# Patient Record
Sex: Female | Born: 1965 | Race: White | Hispanic: No | Marital: Married | Smoking: Never smoker
Health system: Southern US, Community
[De-identification: ages and names within clinical notes are randomized; demographics above are authoritative.]

## PROBLEM LIST (undated history)

## (undated) DIAGNOSIS — H81391 Other peripheral vertigo, right ear: Secondary | ICD-10-CM

## (undated) DIAGNOSIS — I10 Essential (primary) hypertension: Secondary | ICD-10-CM

## (undated) DIAGNOSIS — G43109 Migraine with aura, not intractable, without status migrainosus: Secondary | ICD-10-CM

## (undated) DIAGNOSIS — D329 Benign neoplasm of meninges, unspecified: Secondary | ICD-10-CM

## (undated) DIAGNOSIS — H8309 Labyrinthitis, unspecified ear: Secondary | ICD-10-CM

## (undated) DIAGNOSIS — E785 Hyperlipidemia, unspecified: Secondary | ICD-10-CM

## (undated) HISTORY — PX: OVARIAN CYST SURGERY: SHX726

## (undated) HISTORY — PX: ABDOMINAL SURGERY: SHX537

---

## 2015-04-07 ENCOUNTER — Encounter (HOSPITAL_COMMUNITY): Payer: Self-pay | Admitting: Emergency Medicine

## 2015-04-07 ENCOUNTER — Emergency Department (HOSPITAL_COMMUNITY)
Admission: EM | Admit: 2015-04-07 | Discharge: 2015-04-07 | Disposition: A | Discharge status: Home / Self Care | Payer: Medicaid Other | Source: Home / Self Care | Attending: Emergency Medicine | Admitting: Emergency Medicine

## 2015-04-07 DIAGNOSIS — E785 Hyperlipidemia, unspecified: Secondary | ICD-10-CM

## 2015-04-07 DIAGNOSIS — I10 Essential (primary) hypertension: Secondary | ICD-10-CM | POA: Diagnosis not present

## 2015-04-07 HISTORY — DX: Hyperlipidemia, unspecified: E78.5

## 2015-04-07 HISTORY — DX: Essential (primary) hypertension: I10

## 2015-04-07 MED ORDER — NEBIVOLOL HCL 10 MG PO TABS: 10.0000 mg | ORAL_TABLET | Freq: Two times a day (BID) | ORAL | Status: DC

## 2015-04-07 MED ORDER — NEBIVOLOL HCL 5 MG PO TABS: 5.0000 mg | ORAL_TABLET | Freq: Two times a day (BID) | ORAL | Status: DC

## 2015-04-07 MED ORDER — ATORVASTATIN CALCIUM 20 MG PO TABS: 20.0000 mg | ORAL_TABLET | Freq: Every day | ORAL | Status: DC

## 2015-04-07 NOTE — Discharge Instructions (Signed)
I have refilled your Nebivolol. Medicaid does not cover the cholesterol medication.  I have changed it to Lipitor 20mg  daily. Follow up with a family doctor as soon as possible.

## 2015-04-07 NOTE — ED Provider Notes (Signed)
CSN: [id number]     Arrival date & time 04/07/15  1610 History   First MD Initiated Contact with Patient 04/07/15 1723     Chief Complaint  Patient presents with  . Medication Refill   (Consider location/radiation/quality/duration/timing/severity/associated sxs/prior Treatment) HPI  She is a 49 year old woman here for medication refill. She just arrived to [place] 2 weeks ago from [place]. She is a [place] refugee. In [place] she was diagnosed with hypertension and hyperlipidemia. She was started on Bystolic and Crestor.  She had an echocardiogram done that showed normal ejection fracture with some diastolic dysfunction. She reports feeling well, that she only has a week of her medication left. She states her case manager is getting her an appointment with a primary care doctor.  Past Medical History  Diagnosis Date  . Hypertension   . HLD (hyperlipidemia)    Past Surgical History  Procedure Laterality Date  . Abdominal surgery     History reviewed. No pertinent family history. History  Substance Use Topics  . Smoking status: Never Smoker   . Smokeless tobacco: Never Used  . Alcohol Use: No   OB History    No data available     Review of Systems As in history of present illness Allergies  Review of patient's allergies indicates no known allergies.  Home Medications   Prior to Admission medications   Medication Sig Start Date End Date Taking? Authorizing Provider  UNABLE TO FIND Take 10 mg by mouth daily. Med Name: Myrtie Neither [facility])   Yes Historical Provider, MD  atorvastatin (LIPITOR) 20 MG tablet Take 1 tablet (20 mg total) by mouth daily. 04/07/15   Melony Overly, MD  nebivolol (BYSTOLIC) 10 MG tablet Take 1 tablet (10 mg total) by mouth 2 (two) times daily. 04/07/15   Melony Overly, MD   BP 146/68 mmHg  Pulse 65  Temp(Src) 98 F (36.7 C) (Oral)  Resp 16  SpO2 97%  LMP 03/24/2015 (Approximate) Physical Exam  Constitutional: She is oriented to person, place, and  time. She appears well-developed and well-nourished. No distress.  Neck: Neck supple.  Cardiovascular: Normal rate, regular rhythm and normal heart sounds.   No murmur heard. Pulmonary/Chest: Effort normal. No respiratory distress. She has no wheezes. She has no rales.  Musculoskeletal: She exhibits no edema.  Neurological: She is alert and oriented to person, place, and time.    ED Course  Procedures (including critical care time) Labs Review Labs Reviewed - No data to display  Imaging Review No results found.   MDM   1. Essential hypertension   2. Hyperlipidemia    I have refilled the Bystolic. I changed Crestor to Lipitor 20 mg daily due to cost. She will establish with a primary care physician as soon as possible.    Melony Overly, MD 04/07/15 [phone]

## 2015-04-07 NOTE — ED Notes (Signed)
Pt has newly arrived here from [place] two weeks ago.  Pt needs a refill on her HBP and HLD medications.  Pt denies any issues.

## 2015-04-20 ENCOUNTER — Encounter (HOSPITAL_COMMUNITY): Payer: Self-pay | Admitting: Emergency Medicine

## 2015-04-20 ENCOUNTER — Emergency Department (HOSPITAL_COMMUNITY)
Admission: EM | Admit: 2015-04-20 | Discharge: 2015-04-20 | Disposition: A | Discharge status: Home / Self Care | Payer: Medicaid Other | Attending: Emergency Medicine | Admitting: Emergency Medicine

## 2015-04-20 ENCOUNTER — Emergency Department (HOSPITAL_COMMUNITY): Payer: Medicaid Other

## 2015-04-20 DIAGNOSIS — Y9302 Activity, running: Secondary | ICD-10-CM | POA: Insufficient documentation

## 2015-04-20 DIAGNOSIS — E785 Hyperlipidemia, unspecified: Secondary | ICD-10-CM | POA: Insufficient documentation

## 2015-04-20 DIAGNOSIS — W1840XA Slipping, tripping and stumbling without falling, unspecified, initial encounter: Secondary | ICD-10-CM | POA: Diagnosis not present

## 2015-04-20 DIAGNOSIS — Y998 Other external cause status: Secondary | ICD-10-CM | POA: Diagnosis not present

## 2015-04-20 DIAGNOSIS — I10 Essential (primary) hypertension: Secondary | ICD-10-CM | POA: Insufficient documentation

## 2015-04-20 DIAGNOSIS — Y9289 Other specified places as the place of occurrence of the external cause: Secondary | ICD-10-CM | POA: Insufficient documentation

## 2015-04-20 DIAGNOSIS — Z79899 Other long term (current) drug therapy: Secondary | ICD-10-CM | POA: Insufficient documentation

## 2015-04-20 DIAGNOSIS — S8992XA Unspecified injury of left lower leg, initial encounter: Secondary | ICD-10-CM | POA: Diagnosis not present

## 2015-04-20 MED ORDER — CYCLOBENZAPRINE HCL 10 MG PO TABS
5.0000 mg | ORAL_TABLET | Freq: Once | ORAL | Status: AC
  Administered 2015-04-20: 5 mg via ORAL
  Filled 2015-04-20: qty 1

## 2015-04-20 MED ORDER — MELOXICAM 15 MG PO TABS: 15.0000 mg | ORAL_TABLET | Freq: Every day | ORAL | Status: DC

## 2015-04-20 MED ORDER — IBUPROFEN 400 MG PO TABS
800.0000 mg | ORAL_TABLET | Freq: Once | ORAL | Status: AC
Start: 2015-04-20 — End: 2015-04-20
  Administered 2015-04-20: 800 mg via ORAL
  Filled 2015-04-20: qty 2

## 2015-04-20 MED ORDER — CYCLOBENZAPRINE HCL 5 MG PO TABS: 10.0000 mg | ORAL_TABLET | Freq: Two times a day (BID) | ORAL | Status: DC | PRN

## 2015-04-20 NOTE — ED Notes (Signed)
Pt reports that she injured L knee 43mo ago and last week she slipped making her knee hurt again. Today pt sts she has been unable to get around the house easily.

## 2015-04-20 NOTE — ED Provider Notes (Signed)
CSN: [id number]     Arrival date & time 04/20/15  1956 History  This chart was scribed for non-physician practitioner, Randa Lynn. Carlota Raspberry, PA-C working with Daleen Bo, MD by Tula Nakayama, ED scribe. This patient was seen in room TR06C/TR06C and the patient's care was started at 10:51 PM   Chief Complaint  Patient presents with  . Knee Pain   HPI  HPI Comments: Katelyn Fields is a 49 y.o. female who presents to the Emergency Department complaining of constant, moderate left knee pain that intermittently radiates to her left shin, started 7 months ago and was exacerbated last week. She states her pain becomes worse with praying on her knees, sitting and walking long distances. Pt reports that she initially injured her anterior knee 7 months ago while she was getting off of a bus. Her pain became worse last week when she was running in the rain, slipped, and felt her knee turn inward while her foot went outward. Pt denies swelling, bruising, numbness and tingling as associated symptoms.  No loc, head or neck pain.   Past Medical History  Diagnosis Date  . Hypertension   . HLD (hyperlipidemia)    Past Surgical History  Procedure Laterality Date  . Abdominal surgery     No family history on file. History  Substance Use Topics  . Smoking status: Never Smoker   . Smokeless tobacco: Never Used  . Alcohol Use: No   OB History    No data available     Review of Systems  Musculoskeletal: Positive for arthralgias and gait problem. Negative for joint swelling.  Skin: Negative for color change and wound.  Neurological: Negative for numbness.  All other systems reviewed and are negative.  Allergies  Review of patient's allergies indicates no known allergies.  Home Medications   Prior to Admission medications   Medication Sig Start Date End Date Taking? Authorizing Provider  atorvastatin (LIPITOR) 20 MG tablet Take 1 tablet (20 mg total) by mouth daily. 04/07/15   Melony Overly, MD   cyclobenzaprine (FLEXERIL) 5 MG tablet Take 2 tablets (10 mg total) by mouth 2 (two) times daily as needed for muscle spasms. 04/20/15   Kimi Kroft Carlota Raspberry, PA-C  meloxicam (MOBIC) 15 MG tablet Take 1 tablet (15 mg total) by mouth daily. 04/20/15   Abilene Mcphee Carlota Raspberry, PA-C  nebivolol (BYSTOLIC) 5 MG tablet Take 1 tablet (5 mg total) by mouth 2 (two) times daily. 04/07/15   Melony Overly, MD  UNABLE TO FIND Take 10 mg by mouth daily. Med Name: Myrtie Neither [facility])    Historical Provider, MD   BP 130/56 mmHg  Pulse 65  Temp(Src) 97.9 F (36.6 C) (Oral)  Resp 18  Ht 5\' 3"  (1.6 m)  Wt 196 lb 3.3 oz (88.999 kg)  BMI 34.77 kg/m2  SpO2 97%  LMP 04/20/2015 Physical Exam  Constitutional: She appears well-developed and well-nourished. No distress.  HENT:  Head: Normocephalic and atraumatic.  Eyes: Conjunctivae and EOM are normal.  Neck: Neck supple. No tracheal deviation present.  Cardiovascular: Normal rate.   Pulmonary/Chest: Effort normal. No respiratory distress.  Musculoskeletal:       Left knee: She exhibits decreased range of motion (due to pain) and effusion. She exhibits no ecchymosis and no deformity. Tenderness found. Medial joint line and lateral joint line tenderness noted.  Exam limited due to body habitus.  Skin: Skin is warm and dry.  Psychiatric: She has a normal mood and affect. Her behavior is normal.  Nursing  note and vitals reviewed.   ED Course  Procedures   DIAGNOSTIC STUDIES: Oxygen Saturation is 97% on RA, normal by my interpretation.    COORDINATION OF CARE: 10:58 PM Discussed treatment plan with pt which includes a knee immobilizer, crutches, Mobic, muscle relaxer and an ortho follow-up. Advised pt to use RICE method. She agreed to plan.  Labs Review Labs Reviewed - No data to display  Imaging Review Dg Knee Complete 4 Views Left  04/20/2015   CLINICAL DATA:  Pain following fall  EXAM: LEFT KNEE - COMPLETE 4+ VIEW  COMPARISON:  None.  FINDINGS: Frontal, lateral, and  bilateral oblique views were obtained. There is no fracture or dislocation. No appreciable joint effusion. Joint spaces appear intact. No erosive change.  IMPRESSION: No fracture or dislocation. No appreciable arthropathy. No effusion.   Electronically Signed   By: Lowella Grip III M.D.   On: 04/20/2015 22:12     EKG Interpretation None      MDM   Final diagnoses:  Knee injury, left, initial encounter    Medications  ibuprofen (ADVIL,MOTRIN) tablet 800 mg (not administered)  cyclobenzaprine (FLEXERIL) tablet 5 mg (not administered)    48 y.o.Katelyn Fields's evaluation in the Emergency Department is complete. It has been determined that no acute conditions requiring further emergency intervention are present at this time. The patient/guardian have been advised of the diagnosis and plan. We have discussed signs and symptoms that warrant return to the ED, such as changes or worsening in symptoms.  Vital signs are stable at discharge. Filed Vitals:   04/20/15 2004  BP: 130/56  Pulse: 65  Temp: 97.9 F (36.6 C)  Resp: 18    Patient/guardian has voiced understanding and agreed to follow-up with the PCP or specialist.  I personally performed the services described in this documentation, which was scribed in my presence. The recorded information has been reviewed and is accurate.    Delos Haring, PA-C 04/20/15 [place], MD 04/21/15 [phone]

## 2015-04-20 NOTE — Discharge Instructions (Signed)

## 2015-04-20 NOTE — ED Notes (Signed)
Pt stable, ambulatory, states understanding of discharge instructions 

## 2015-05-12 ENCOUNTER — Telehealth: Payer: Self-pay | Admitting: *Deleted

## 2015-05-12 NOTE — Telephone Encounter (Signed)
Nelly from [facility] office called to request NPI number for patient.  Pt has appt for new patient (refugee).  NPI given x1, pt needs to contact medicaid office to change provider information on card. Derl Barrow, RN

## 2015-06-03 ENCOUNTER — Ambulatory Visit: Payer: Medicaid Other

## 2015-10-07 DIAGNOSIS — N912 Amenorrhea, unspecified: Secondary | ICD-10-CM

## 2015-10-07 DIAGNOSIS — N96 Recurrent pregnancy loss: Secondary | ICD-10-CM

## 2015-10-07 DIAGNOSIS — R1030 Lower abdominal pain, unspecified: Secondary | ICD-10-CM

## 2015-10-07 NOTE — Congregational Nurse Program (Unsigned)
Congregational Nurse Program Note  Date of Encounter: 10/07/2015  Past Medical History: Past Medical History  Diagnosis Date  . Hypertension   . HLD (hyperlipidemia)     Encounter Details:     CNP Questionnaire - 10/07/15 2108    Patient Demographics   Is this a new or existing patient? New   Patient is considered a/an Refugee   Patient Assistance   Location of Patient Assistance Not Applicable   Patient's financial/insurance status Medicaid   Uninsured Patient No   Patient referred to apply for the following financial assistance Not Applicable   Food insecurities addressed Not Applicable   Transportation assistance No   Assistance securing medications No   Educational health offerings Navigating the healthcare system   Encounter Details   Primary purpose of visit Acute Illness/Condition Visit   Was an Emergency Department visit averted? Not Applicable   Does patient have a medical provider? Yes   Patient referred to Urgent Care   Was a mental health screening completed? (GAINS tool) No   Does patient have dental issues? No   Since previous encounter, have you referred patient for abnormal blood pressure that resulted in a new diagnosis or medication change? No   Since previous encounter, have you referred patient for abnormal blood glucose that resulted in a new diagnosis or medication change? No       Office visit with concern of feeling"bloated". Speaks Arabic and [place].  Experiencing lower abdominal pain with occasional urinary delay, urinary  frequency and lower and upper  back pain standing. Medical history includes abdominal mass removed surgically about 5 years ago while living in [place]. Described as benign at time by physician. 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  Also concerned about constant headache, hair loss, and excess facial hair evident from shaving skin. B/P 124/76.Pulse 61. Respirations 16. Recent lab testing at [facility] for Lipid  Panel, TSH, T4, T3, Hgb A1c/eAG during PCP visit 08-13-15. Report provided to patient on 10-01-15.Requested referral for gynecology evaluation.  Plan: Appointment scheduled at [facility]; 10-29-15 at 1:15 pm. Refer to [facility] today. Provide copy of lab report to attending at Urgent Care.

## 2015-10-29 ENCOUNTER — Ambulatory Visit (INDEPENDENT_AMBULATORY_CARE_PROVIDER_SITE_OTHER): Payer: Medicaid Other | Admitting: Obstetrics and Gynecology

## 2015-10-29 ENCOUNTER — Other Ambulatory Visit (HOSPITAL_COMMUNITY)
Admission: RE | Admit: 2015-10-29 | Discharge: 2015-10-29 | Disposition: A | Discharge status: Home / Self Care | Payer: Medicaid Other | Source: Ambulatory Visit | Attending: Obstetrics and Gynecology | Admitting: Obstetrics and Gynecology

## 2015-10-29 ENCOUNTER — Encounter: Payer: Self-pay | Admitting: Obstetrics and Gynecology

## 2015-10-29 VITALS — BP 131/65 | HR 69 | Temp 97.7°F | Ht 61.02 in | Wt 222.0 lb

## 2015-10-29 DIAGNOSIS — Z124 Encounter for screening for malignant neoplasm of cervix: Secondary | ICD-10-CM

## 2015-10-29 DIAGNOSIS — Z0000 Encounter for general adult medical examination without abnormal findings: Secondary | ICD-10-CM

## 2015-10-29 DIAGNOSIS — R102 Pelvic and perineal pain: Secondary | ICD-10-CM | POA: Diagnosis not present

## 2015-10-29 DIAGNOSIS — Z1231 Encounter for screening mammogram for malignant neoplasm of breast: Secondary | ICD-10-CM

## 2015-10-29 DIAGNOSIS — Z1151 Encounter for screening for human papillomavirus (HPV): Secondary | ICD-10-CM | POA: Diagnosis not present

## 2015-10-29 DIAGNOSIS — R339 Retention of urine, unspecified: Secondary | ICD-10-CM | POA: Diagnosis not present

## 2015-10-29 DIAGNOSIS — Z01419 Encounter for gynecological examination (general) (routine) without abnormal findings: Secondary | ICD-10-CM | POA: Diagnosis not present

## 2015-10-29 DIAGNOSIS — Z0289 Encounter for other administrative examinations: Secondary | ICD-10-CM

## 2015-10-29 LAB — POCT URINALYSIS DIP (DEVICE)
Bilirubin Urine: NEGATIVE
GLUCOSE, UA: NEGATIVE mg/dL
HGB URINE DIPSTICK: NEGATIVE
Ketones, ur: NEGATIVE mg/dL
Leukocytes, UA: NEGATIVE
NITRITE: NEGATIVE
PROTEIN: NEGATIVE mg/dL
Specific Gravity, Urine: 1.025 (ref 1.005–1.030)
UROBILINOGEN UA: 0.2 mg/dL (ref 0.0–1.0)
pH: 6.5 (ref 5.0–8.0)

## 2015-10-29 NOTE — Progress Notes (Signed)
  Subjective:     Katelyn Fields is a 50 y.o. female [id number] who is here for a comprehensive physical exam. The patient reports several issues. She reports irregular menses for the past few months. She describes a faithful 27-day cycle with vaginal bleeding for 4-6 days monthly. However, recently she has skipped menses. She also describes vaginal dryness with intercourse. Patient also reports urinary frequency and feels that she is not completely emptying her bladder. She also reports a long standing history of infertility and is still hoping to conceive. She also reports feeling bloated and gassy all the time.  Past Medical History  Diagnosis Date  . Hypertension   . HLD (hyperlipidemia)    Past Surgical History  Procedure Laterality Date  . Abdominal surgery     No family history on file.  Social History   Social History  . Marital Status: Married    Spouse Name: N/A  . Number of Children: N/A  . Years of Education: N/A   Occupational History  . Not on file.   Social History Main Topics  . Smoking status: Never Smoker   . Smokeless tobacco: Never Used  . Alcohol Use: No  . Drug Use: No  . Sexual Activity: Not on file   Other Topics Concern  . Not on file   Social History Narrative   Health Maintenance  Topic Date Due  . HIV Screening  05/31/1981  . TETANUS/TDAP  05/31/1985  . PAP SMEAR  06/01/1987  . INFLUENZA VACCINE  04/13/2015       Review of Systems Pertinent items are noted in HPI.   Objective:   Filed Vitals:   10/29/15 1439  BP: 131/65  Pulse: 69  Temp: 97.7 F (36.5 C)       GENERAL: Well-developed, well-nourished female in no acute distress.  HEENT: Normocephalic, atraumatic. Sclerae anicteric.  NECK: Supple. Normal thyroid.  LUNGS: Clear to auscultation bilaterally.  HEART: Regular rate and rhythm. BREASTS: Symmetric in size. No palpable masses or lymphadenopathy, skin changes, or nipple drainage. ABDOMEN: Soft, nontender, nondistended. No  organomegaly. PELVIC: Normal external female genitalia. Vagina is pink and rugated.  Normal discharge. Normal appearing cervix. Bimanual exam limited secondary to body habitus. No adnexal mass or tenderness were palpated. EXTREMITIES: No cyanosis, clubbing, or edema, 2+ distal pulses.    Assessment:    Healthy female exam.      Plan:    Pap smear collected Screening mammogram scheduled Urine culture Pelvic ultrasound ordered Informed patient that irregular menses may be related to perimenopausal state. Advised to keep a menstrual calendar and to return if she experiences 2 menses per month or postmenopausal vaginal bleeding Due to the financial burden, patient is not interested in pursuing any infertility work up at this time Patient advised to perform monthly self breast and vulva exam Patient will be contacted with any abnormal results RTC prn See After Visit Summary for Counseling Recommendations

## 2015-10-30 LAB — URINE CULTURE
Colony Count: NO GROWTH
Organism ID, Bacteria: NO GROWTH

## 2015-11-02 LAB — CYTOLOGY - PAP

## 2015-11-05 ENCOUNTER — Ambulatory Visit (HOSPITAL_COMMUNITY)
Admission: RE | Admit: 2015-11-05 | Discharge: 2015-11-05 | Disposition: A | Discharge status: Home / Self Care | Payer: Medicaid Other | Source: Ambulatory Visit | Attending: Obstetrics and Gynecology | Admitting: Obstetrics and Gynecology

## 2015-11-05 DIAGNOSIS — N926 Irregular menstruation, unspecified: Secondary | ICD-10-CM | POA: Diagnosis not present

## 2015-11-05 DIAGNOSIS — R102 Pelvic and perineal pain: Secondary | ICD-10-CM | POA: Diagnosis present

## 2015-11-05 DIAGNOSIS — D259 Leiomyoma of uterus, unspecified: Secondary | ICD-10-CM | POA: Insufficient documentation

## 2015-11-05 DIAGNOSIS — N888 Other specified noninflammatory disorders of cervix uteri: Secondary | ICD-10-CM | POA: Diagnosis not present

## 2015-11-05 DIAGNOSIS — R140 Abdominal distension (gaseous): Secondary | ICD-10-CM | POA: Diagnosis not present

## 2015-11-09 ENCOUNTER — Telehealth: Payer: Self-pay | Admitting: General Practice

## 2015-11-09 NOTE — Telephone Encounter (Signed)
Per Dr Elly Modena, patient's ultrasound shows fibroids on her uterus. The fibroid sizes are not responsible for her feeling bloated. She should follow up with a PCP. Called patient with pacific interpreter [phone], no answer- left message stating we are trying to reach you with non urgent results, please call us back at the clinics

## 2015-11-11 DIAGNOSIS — R1030 Lower abdominal pain, unspecified: Secondary | ICD-10-CM

## 2015-11-11 DIAGNOSIS — N96 Recurrent pregnancy loss: Secondary | ICD-10-CM

## 2015-11-11 DIAGNOSIS — N912 Amenorrhea, unspecified: Secondary | ICD-10-CM

## 2015-11-13 NOTE — Telephone Encounter (Signed)
Called patient with pacific interpreter [phone] and informed patient of results and recommendations. Patient verbalized understanding & asked about her urine results. Told patient they were negative. Patient verbalized understanding & had no other questions.

## 2015-11-18 DIAGNOSIS — R1030 Lower abdominal pain, unspecified: Secondary | ICD-10-CM

## 2015-11-18 DIAGNOSIS — N96 Recurrent pregnancy loss: Secondary | ICD-10-CM

## 2015-11-18 DIAGNOSIS — N912 Amenorrhea, unspecified: Secondary | ICD-10-CM

## 2015-11-18 NOTE — Congregational Nurse Program (Signed)
Congregational Nurse Program Note  Date of Encounter: 11/11/2015  Past Medical History: Past Medical History  Diagnosis Date  . Hypertension   . HLD (hyperlipidemia)     Encounter Details:     CNP Questionnaire - 11/18/15 0003    Patient Demographics   Is this a new or existing patient? New   Patient is considered a/an Refugee   Patient Assistance   Location of Patient Assistance Not Applicable   Patient's financial/insurance status Medicaid   Uninsured Patient No   Patient referred to apply for the following financial assistance Not Applicable   Food insecurities addressed Not Applicable   Transportation assistance No   Assistance securing medications No   Educational health offerings Navigating the healthcare system   Encounter Details   Primary purpose of visit Acute Illness/Condition Visit   Was an Emergency Department visit averted? Not Applicable   Does patient have a medical provider? Yes   Patient referred to Urgent Care   Was a mental health screening completed? (GAINS tool) No   Does patient have dental issues? No   Since previous encounter, have you referred patient for abnormal blood pressure that resulted in a new diagnosis or medication change? No   Since previous encounter, have you referred patient for abnormal blood glucose that resulted in a new diagnosis or medication change? No     Office visit requesting assistance to navigate access to medical tests performed at [facility] on 02[facility]16/17for abdominal pain. Assisted with call and message left for client's report. Unable to access information.Plan: Reassured and urged client to follow-up on Thursday with clinic or return to [facility]  nurse office on 11-18-15 for assistance. Reapply for Medicaid or [employer]. Jannetta Quint, RN/CN. [phone].

## 2015-11-18 NOTE — Congregational Nurse Program (Signed)
Congregational Nurse Program Note  Date of Encounter: 11/18/2015  Past Medical History: Past Medical History  Diagnosis Date  . Hypertension   . HLD (hyperlipidemia)     Encounter Details:     CNP Questionnaire - 11/18/15 0003    Patient Demographics   Is this a new or existing patient? New   Patient is considered a/an Refugee   Patient Assistance   Location of Patient Assistance Not Applicable   Patient's financial/insurance status Medicaid   Uninsured Patient No   Patient referred to apply for the following financial assistance Not Applicable   Food insecurities addressed Not Applicable   Transportation assistance No   Assistance securing medications No   Educational health offerings Navigating the healthcare system   Encounter Details   Primary purpose of visit Acute Illness/Condition Visit   Was an Emergency Department visit averted? Not Applicable   Does patient have a medical provider? Yes   Patient referred to Urgent Care   Was a mental health screening completed? (GAINS tool) No   Does patient have dental issues? No   Since previous encounter, have you referred patient for abnormal blood pressure that resulted in a new diagnosis or medication change? No   Since previous encounter, have you referred patient for abnormal blood glucose that resulted in a new diagnosis or medication change? No     Office visit at [facility] to confirm test results received from [facility] 11/15/15. Referred for mammogram and follow-up with Gastroenterologist per client. Follow-up with PCP for referral. Return prn. Jannetta Quint, RN/CN

## 2015-11-23 ENCOUNTER — Ambulatory Visit
Admission: RE | Admit: 2015-11-23 | Discharge: 2015-11-23 | Disposition: A | Discharge status: Home / Self Care | Payer: Medicaid Other | Source: Ambulatory Visit | Attending: Obstetrics and Gynecology | Admitting: Obstetrics and Gynecology

## 2015-11-23 DIAGNOSIS — Z1231 Encounter for screening mammogram for malignant neoplasm of breast: Secondary | ICD-10-CM

## 2015-11-26 ENCOUNTER — Other Ambulatory Visit: Payer: Self-pay | Admitting: Obstetrics and Gynecology

## 2015-11-26 DIAGNOSIS — R928 Other abnormal and inconclusive findings on diagnostic imaging of breast: Secondary | ICD-10-CM

## 2015-12-02 ENCOUNTER — Other Ambulatory Visit: Payer: Medicaid Other

## 2015-12-07 ENCOUNTER — Other Ambulatory Visit (HOSPITAL_COMMUNITY): Payer: Self-pay | Admitting: *Deleted

## 2015-12-07 DIAGNOSIS — R928 Other abnormal and inconclusive findings on diagnostic imaging of breast: Secondary | ICD-10-CM

## 2015-12-09 ENCOUNTER — Other Ambulatory Visit: Payer: Medicaid Other

## 2015-12-10 ENCOUNTER — Ambulatory Visit
Admission: RE | Admit: 2015-12-10 | Discharge: 2015-12-10 | Disposition: A | Discharge status: Home / Self Care | Payer: Medicaid Other | Source: Ambulatory Visit | Attending: Obstetrics and Gynecology | Admitting: Obstetrics and Gynecology

## 2015-12-10 ENCOUNTER — Other Ambulatory Visit (HOSPITAL_COMMUNITY): Payer: Self-pay | Admitting: Obstetrics and Gynecology

## 2015-12-10 ENCOUNTER — Encounter (HOSPITAL_COMMUNITY): Payer: Self-pay

## 2015-12-10 ENCOUNTER — Ambulatory Visit (HOSPITAL_COMMUNITY)
Admission: RE | Admit: 2015-12-10 | Discharge: 2015-12-10 | Disposition: A | Discharge status: Home / Self Care | Payer: Medicaid Other | Source: Ambulatory Visit | Attending: Obstetrics and Gynecology | Admitting: Obstetrics and Gynecology

## 2015-12-10 ENCOUNTER — Ambulatory Visit
Admission: RE | Admit: 2015-12-10 | Discharge: 2015-12-10 | Disposition: A | Discharge status: Home / Self Care | Payer: No Typology Code available for payment source | Source: Ambulatory Visit | Attending: Obstetrics and Gynecology | Admitting: Obstetrics and Gynecology

## 2015-12-10 VITALS — BP 124/84 | Temp 98.4°F | Ht 60.0 in | Wt 227.0 lb

## 2015-12-10 DIAGNOSIS — R928 Other abnormal and inconclusive findings on diagnostic imaging of breast: Secondary | ICD-10-CM

## 2015-12-10 DIAGNOSIS — Z1239 Encounter for other screening for malignant neoplasm of breast: Secondary | ICD-10-CM

## 2015-12-10 NOTE — Patient Instructions (Signed)
Educational materials on self breast awareness given. Explained to Katelyn Fields that she did not need a Pap smear today due to last Pap smear and HPV typing was 10/29/2015. Let her know BCCCP will cover Pap smears and HPV typing every 5 years unless has a history of abnormal Pap smears. Referred patient to the [facility] for a right breast diagnostic mammogram per recommendation. Appointment scheduled for Thursday, December 10, 2015 at 1100. Patient aware of appointment and will be there. Katelyn Fields verbalized understanding.  Johnthomas Lader, Arvil Chaco, RN 12:21 PM

## 2015-12-10 NOTE — Progress Notes (Signed)
Patient referred to [facility] by the [facility] due to recommending additional imaging of the right breast. Screening mammogram completed 11/23/2015.  Pap Smear: Pap smear not completed today. Last Pap smear was 10/29/2015 at the [facility] and normal with negative HPV. Per patient has no history of an abnormal Pap smear. Last Pap smear result is in EPIC.  Physical exam: Breasts Breasts symmetrical. No skin abnormalities bilateral breasts. No nipple retraction bilateral breasts. No nipple discharge bilateral breasts. No lymphadenopathy. No lumps palpated bilateral breasts. No complaints of pain or tenderness on exam. Referred patient to the [facility] for a right breast diagnostic mammogram per recommendation. Appointment scheduled for Thursday, December 10, 2015 at 1100.  Pelvic/Bimanual No Pap smear completed today since last Pap smear was 10/29/2015. Pap smear not indicated per BCCCP guidelines.   Smoking History: Patient has never smoked.  Patient Navigation: Patient education provided. Access to services provided for patient through [facility] program. Arabic interpreter provided.   Used Arabic interpreter [employer].

## 2015-12-22 ENCOUNTER — Encounter (HOSPITAL_COMMUNITY): Payer: Self-pay | Admitting: *Deleted

## 2016-05-10 ENCOUNTER — Other Ambulatory Visit: Payer: Self-pay | Admitting: Obstetrics and Gynecology

## 2016-05-10 DIAGNOSIS — N63 Unspecified lump in unspecified breast: Secondary | ICD-10-CM

## 2016-05-19 ENCOUNTER — Ambulatory Visit
Admission: RE | Admit: 2016-05-19 | Discharge: 2016-05-19 | Disposition: A | Discharge status: Home / Self Care | Payer: No Typology Code available for payment source | Source: Ambulatory Visit | Attending: Obstetrics and Gynecology | Admitting: Obstetrics and Gynecology

## 2016-05-19 DIAGNOSIS — N63 Unspecified lump in unspecified breast: Secondary | ICD-10-CM

## 2016-12-02 ENCOUNTER — Other Ambulatory Visit (HOSPITAL_COMMUNITY): Payer: Self-pay | Admitting: *Deleted

## 2016-12-02 ENCOUNTER — Telehealth (HOSPITAL_COMMUNITY): Payer: Self-pay | Admitting: *Deleted

## 2016-12-02 DIAGNOSIS — N6489 Other specified disorders of breast: Secondary | ICD-10-CM

## 2016-12-02 NOTE — Telephone Encounter (Signed)
Telephoned patient at home number and advised patient that we rescheduled appointment to 8:45 on April 3. If any questions to give me a call back.

## 2016-12-13 ENCOUNTER — Ambulatory Visit (HOSPITAL_COMMUNITY)
Admission: RE | Admit: 2016-12-13 | Discharge: 2016-12-13 | Disposition: A | Discharge status: Home / Self Care | Payer: Self-pay | Source: Ambulatory Visit | Attending: Obstetrics and Gynecology | Admitting: Obstetrics and Gynecology

## 2016-12-13 ENCOUNTER — Ambulatory Visit
Admission: RE | Admit: 2016-12-13 | Discharge: 2016-12-13 | Disposition: A | Discharge status: Home / Self Care | Payer: No Typology Code available for payment source | Source: Ambulatory Visit | Attending: Obstetrics and Gynecology | Admitting: Obstetrics and Gynecology

## 2016-12-13 ENCOUNTER — Encounter (HOSPITAL_COMMUNITY): Payer: Self-pay

## 2016-12-13 ENCOUNTER — Ambulatory Visit (HOSPITAL_COMMUNITY): Payer: No Typology Code available for payment source

## 2016-12-13 VITALS — BP 162/88 | Temp 98.6°F | Ht 62.0 in | Wt 230.4 lb

## 2016-12-13 DIAGNOSIS — Z1239 Encounter for other screening for malignant neoplasm of breast: Secondary | ICD-10-CM

## 2016-12-13 DIAGNOSIS — N6489 Other specified disorders of breast: Secondary | ICD-10-CM

## 2016-12-13 NOTE — Progress Notes (Signed)
Patient referred to [facility] by the [facility] due to recommending six month follow up. Last right breast diagnostic mammogram was completed 05/19/2016.  Pap Smear: Pap smear not completed today. Last Pap smear was 10/29/2015 at the [facility] and normal with negative HPV. Per patient has no history of an abnormal Pap smear. Last Pap smear result is in EPIC.  Physical exam: Breasts Breasts symmetrical. No skin abnormalities bilateral breasts. No nipple retraction bilateral breasts. No nipple discharge bilateral breasts. No lymphadenopathy. No lumps palpated bilateral breasts. No complaints of pain or tenderness on exam. Referred patient to the [facility] for a bilateral diagnostic mammogram and possible right breast ultrasound per recommendation. Appointment scheduled for Tuesday, December 13, 2016 at 1250.      Pelvic/Bimanual No Pap smear completed today since last Pap smear and HPV typing was 10/19/2015. Pap smear not indicated per BCCCP guidelines.   Smoking History: Patient has never smoked.  Patient Navigation: Patient education provided. Access to services provided for patient through [facility] program. Arabic interpreter provided.  Colorectal Cancer Screening: Per patient has never had a colonoscopy completed. No complaints today. FIT Test given to patient to complete and return to BCCCP.  Used Spanish interpreter Margarette Canada from [facility].

## 2016-12-13 NOTE — Patient Instructions (Signed)
Explained breast self awareness with [name]. Patient did not need a Pap smear today due to last Pap smear and HPV typing was 10/29/2015. Let her know BCCCP will cover Pap smears and HPV typing every 5 years unless has a history of abnormal Pap smears. Referred patient to the [facility] for a bilateral diagnostic mammogram and possible right breast ultrasound per recommendation. Appointment scheduled for Tuesday, December 13, 2016 at 1250. Katelyn Fields verbalized understanding.  [name], Arvil [place], RN 11:15 AM

## 2016-12-14 ENCOUNTER — Encounter (HOSPITAL_COMMUNITY): Payer: Self-pay | Admitting: *Deleted

## 2016-12-23 ENCOUNTER — Ambulatory Visit: Payer: No Typology Code available for payment source | Admitting: *Deleted

## 2016-12-23 ENCOUNTER — Other Ambulatory Visit: Payer: Self-pay

## 2016-12-23 ENCOUNTER — Other Ambulatory Visit (HOSPITAL_BASED_OUTPATIENT_CLINIC_OR_DEPARTMENT_OTHER): Payer: Self-pay

## 2016-12-23 VITALS — BP 132/70 | Ht 62.0 in | Wt 230.5 lb

## 2016-12-23 DIAGNOSIS — Z0000 Encounter for general adult medical examination without abnormal findings: Secondary | ICD-10-CM

## 2016-12-23 LAB — GLUCOSE (CC13): Glucose: 102 mg/dl (ref 70–140)

## 2016-12-23 NOTE — Progress Notes (Signed)
[facility] Initial Screening  Interpretor: sarra gabralla  Patient referred to [facility] from [facility].  Clinical Measurement: HT:5'2 WT:230.5lb BP:132/70 BPx2:130/70 fasting labs drawn today, will review with patient when they result.   Medical History: patient has HTN, she does not have high cholesterol or diabetes. She has never been diagnosed with heart disease.   Medication:Patient takes BP medication  Blood Pressure, Self Measurement:she measures her BP a few times a week at work. She does not report these numbers to her physician as she has not seen her PCP recently.   Nutrition:Patient eats 2 cups of fruit a day, 1 cup of vegetables. She doesn't eat fish as it makes her stomach upset. She eats more than 3 ounces of whole grains daily. She doesn't drink sugary beverages. She watches her sodium and salt intake.   Physical Activity: Patient gets 15 min a day of moderate physical activity. She does not get any vigorous physical activity right now  Smoking Status: she is not a smoker  Quality of Life:Patient denies any mental/physical health issues preventing her from performing her normal daily activities.   Risk Reduction and Counseling: patients goal is to lose weight. She will primarly focus on portion control and eating 6 small meals a day. She typically eats 2 big meals a day and loves pasta. She will work on cutting out pasta slowly, she was provided with health coaching session today, [employer] and [employer].  Navigation:will call patient with lab results within a week. She undertsands there will be 2 more health coaching sessions via phone followed by a final follow up call.

## 2016-12-24 LAB — PLEASE NOTE

## 2016-12-24 LAB — FECAL OCCULT BLOOD, IMMUNOCHEMICAL: FECAL OCCULT BLD: NEGATIVE

## 2016-12-26 LAB — LIPID PANEL
CHOLESTEROL TOTAL: 237 mg/dL — AB (ref 100–199)
Chol/HDL Ratio: 5 ratio — ABNORMAL HIGH (ref 0.0–4.4)
HDL: 47 mg/dL (ref >39)
LDL CALC: 132 mg/dL — AB (ref 0–99)
TRIGLYCERIDES: 292 mg/dL — AB (ref 0–149)
VLDL Cholesterol Cal: 58 mg/dL — ABNORMAL HIGH (ref 5–40)

## 2016-12-26 LAB — HEMOGLOBIN A1C
ESTIMATED AVERAGE GLUCOSE: 111 mg/dL
HEMOGLOBIN A1C: 5.5 % (ref 4.8–5.6)

## 2016-12-28 ENCOUNTER — Telehealth: Payer: Self-pay

## 2016-12-28 NOTE — Telephone Encounter (Signed)
Health Coaching #2  Initial Screening:12/23/2016  Labs: Fasting Nonfasting- Choelsterol: 237 Triglycerides:292 LDL:132 HDL:47Glucose:102 A1C:5.5  Patient understands lab results. She was informed the importance of physical activity and diet high in fiber low in fat  Goal:patients main goal was to lose weight. She has been working on cutting out pastas. Informed pt importance of this especially in cholesterol levels.   Navigation: Time spent with patient via phone: 10 Minutes. there will be a third health coaching session within 1 week.

## 2017-01-02 ENCOUNTER — Telehealth: Payer: Self-pay

## 2017-01-02 NOTE — Telephone Encounter (Signed)
Health Coaching #3  Initial Screening: 12/23/2016  Goal: patients goal is to lose weight. She has begun cutting out some starchy foods, mainly pasta. She will continue.  Navigation: Time spent with patient via phone: 10 Minutes, patient understands there will be a final follow up call in 4- 6 weeks.

## 2017-01-09 ENCOUNTER — Encounter (HOSPITAL_COMMUNITY): Payer: Self-pay | Admitting: *Deleted

## 2017-01-09 NOTE — Progress Notes (Signed)
Letter mailed to patient with negative Fit Test results.  

## 2017-05-18 ENCOUNTER — Other Ambulatory Visit: Payer: Self-pay | Admitting: Obstetrics and Gynecology

## 2017-05-18 DIAGNOSIS — N6489 Other specified disorders of breast: Secondary | ICD-10-CM

## 2017-06-19 ENCOUNTER — Ambulatory Visit: Admission: RE | Admit: 2017-06-19 | Payer: No Typology Code available for payment source | Source: Ambulatory Visit

## 2017-06-19 ENCOUNTER — Ambulatory Visit
Admission: RE | Admit: 2017-06-19 | Discharge: 2017-06-19 | Disposition: A | Discharge status: Home / Self Care | Payer: No Typology Code available for payment source | Source: Ambulatory Visit | Attending: Obstetrics and Gynecology | Admitting: Obstetrics and Gynecology

## 2017-06-19 DIAGNOSIS — N6489 Other specified disorders of breast: Secondary | ICD-10-CM

## 2017-10-17 ENCOUNTER — Encounter: Payer: Self-pay | Admitting: *Deleted

## 2017-10-25 ENCOUNTER — Encounter (HOSPITAL_COMMUNITY): Payer: Self-pay

## 2017-11-10 ENCOUNTER — Encounter: Payer: Self-pay | Admitting: Obstetrics & Gynecology

## 2017-11-16 ENCOUNTER — Ambulatory Visit (INDEPENDENT_AMBULATORY_CARE_PROVIDER_SITE_OTHER): Payer: BLUE CROSS/BLUE SHIELD | Admitting: Obstetrics and Gynecology

## 2017-11-16 ENCOUNTER — Encounter: Payer: Self-pay | Admitting: Obstetrics and Gynecology

## 2017-11-16 VITALS — BP 129/67 | HR 72 | Ht 61.42 in | Wt 281.0 lb

## 2017-11-16 DIAGNOSIS — D259 Leiomyoma of uterus, unspecified: Secondary | ICD-10-CM

## 2017-11-16 LAB — POCT URINALYSIS DIP (DEVICE)
BILIRUBIN URINE: NEGATIVE
Glucose, UA: NEGATIVE mg/dL
KETONES UR: NEGATIVE mg/dL
LEUKOCYTES UA: NEGATIVE
NITRITE: NEGATIVE
PH: 5.5 (ref 5.0–8.0)
Protein, ur: NEGATIVE mg/dL
Specific Gravity, Urine: 1.025 (ref 1.005–1.030)
Urobilinogen, UA: 0.2 mg/dL (ref 0.0–1.0)

## 2017-11-16 NOTE — Progress Notes (Signed)
52 yo P0040 here for follow up on fibroid uterus. Patient reports feeling well. She continues to have a 4-5 day monthly period, She states that she skips months occasionally. She is sexually active without complaints. She denies any urinary incontinence. She denies any pelvic pain or abnormal discharge  Past Medical History:  Diagnosis Date  . HLD (hyperlipidemia)   . Hypertension    Past Surgical History:  Procedure Laterality Date  . ABDOMINAL SURGERY    . OVARIAN CYST SURGERY     Family History  Problem Relation Age of Onset  . Hypertension Mother   . Hypertension Father   . Hypertension Sister   . Asthma Sister   . Diabetes Brother    Social History   Tobacco Use  . Smoking status: Never Smoker  . Smokeless tobacco: Never Used  Substance Use Topics  . Alcohol use: No  . Drug use: No   ROS See pertinent in HPI Blood pressure 129/67, pulse 72, height 5' 1.42" (1.56 m), weight 281 lb (127.5 kg), last menstrual period 10/24/2017. GENERAL: Well-developed, well-nourished female in no acute distress.  ABDOMEN: Soft, nontender, nondistended. No organomegaly. PELVIC: Normal external female genitalia. Vagina is pink and rugated.  Normal discharge. Normal appearing cervix. Uterus is globular in size, approximately 12-weeks. No adnexal mass or tenderness. EXTREMITIES: No cyanosis, clubbing, or edema, 2+ distal pulses.  A/P 52 yo perimenopausal here for follow up on fibroid uterus - Patient states she had a pelvic ultrasound in January 2019- records requested - Patient will be contacted if further follow up is necessary - Reassurance provided as patient is currently asymptomatic - Defined menopause along the expectations as she progresses towards menopause - RTC prn

## 2017-12-07 ENCOUNTER — Other Ambulatory Visit: Payer: Self-pay | Admitting: Family Medicine

## 2017-12-07 DIAGNOSIS — Z1231 Encounter for screening mammogram for malignant neoplasm of breast: Secondary | ICD-10-CM

## 2017-12-29 ENCOUNTER — Ambulatory Visit
Admission: RE | Admit: 2017-12-29 | Discharge: 2017-12-29 | Disposition: A | Discharge status: Home / Self Care | Payer: BLUE CROSS/BLUE SHIELD | Source: Ambulatory Visit | Attending: Family Medicine | Admitting: Family Medicine

## 2017-12-29 DIAGNOSIS — Z1231 Encounter for screening mammogram for malignant neoplasm of breast: Secondary | ICD-10-CM

## 2019-11-18 ENCOUNTER — Ambulatory Visit (INDEPENDENT_AMBULATORY_CARE_PROVIDER_SITE_OTHER): Payer: 59 | Admitting: Family Medicine

## 2019-11-18 ENCOUNTER — Other Ambulatory Visit: Payer: Self-pay

## 2019-11-18 ENCOUNTER — Encounter: Payer: Self-pay | Admitting: Family Medicine

## 2019-11-18 VITALS — BP 120/72 | HR 53 | Ht 62.0 in | Wt 188.0 lb

## 2019-11-18 DIAGNOSIS — H539 Unspecified visual disturbance: Secondary | ICD-10-CM

## 2019-11-18 DIAGNOSIS — E7800 Pure hypercholesterolemia, unspecified: Secondary | ICD-10-CM

## 2019-11-18 DIAGNOSIS — Z114 Encounter for screening for human immunodeficiency virus [HIV]: Secondary | ICD-10-CM

## 2019-11-18 DIAGNOSIS — E785 Hyperlipidemia, unspecified: Secondary | ICD-10-CM | POA: Insufficient documentation

## 2019-11-18 DIAGNOSIS — Z0000 Encounter for general adult medical examination without abnormal findings: Secondary | ICD-10-CM | POA: Insufficient documentation

## 2019-11-18 DIAGNOSIS — L659 Nonscarring hair loss, unspecified: Secondary | ICD-10-CM

## 2019-11-18 DIAGNOSIS — E559 Vitamin D deficiency, unspecified: Secondary | ICD-10-CM

## 2019-11-18 DIAGNOSIS — Z1159 Encounter for screening for other viral diseases: Secondary | ICD-10-CM

## 2019-11-18 DIAGNOSIS — Z113 Encounter for screening for infections with a predominantly sexual mode of transmission: Secondary | ICD-10-CM

## 2019-11-18 DIAGNOSIS — I10 Essential (primary) hypertension: Secondary | ICD-10-CM

## 2019-11-18 MED ORDER — POLYETHYLENE GLYCOL 3350 17 GM/SCOOP PO POWD: 17.0000 g | Freq: Every day | ORAL | 3 refills | Status: DC

## 2019-11-18 NOTE — Assessment & Plan Note (Signed)
Previously on atenolol, discussed. BP at goal. Has had significant weight loss. Will trial off for 1 month, monitor BP at home.

## 2019-11-18 NOTE — Progress Notes (Addendum)
Subjective  Katelyn Fields is a 54 y.o. female is presenting with the following: establish care.  The patient declines an interpreter. She reports overall she is doing well. She has multiple concerns today.  Alopecia The patient reports years of hair loss. Patter includes superior aspect of head and temporal area. No flaking or underlying rash. Reports her father had female pattern baldness. She is very worried about related to a vitamin deficiency. Takes biotin each day in [facility]VI.   Excess hair In addition to hair loss, the patient waxes her submandibular area q4weeks. She has regular menses every 26 days, last 5 days, moderate in intensity. 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 Associated with bloating. Has two hard BM per day. No nausea, vomiting, melena, hematochezia, or unintentional weight loss. Has not had a colonoscopy, is not certain she wants one. Has not tried anything for her symptoms. She does take a probiotics.   HTN Taking atenolol 25 mg daily. No chest pain, vision changes. No side effects from medication.   HLD Recommended to take statin by prior provider. She has stopped this medication.   The patient has been taking Herbalife supplements. She has had a desired weight loss of ~50 pounds. Reports she feels great. Eats 3 meals per day, lots of vegetables and fruits. Still enjoys food and eating.   PMH HTN HLD Obesity- improved Leiomyomas  Still has regular menses  PSH-  Ovarian cystectomy   FHx  HTN  Social history: lives with husband, no children, non smoker, no EtOH use   ROS: also positive for vaginal dryness. Negative for headaches, positive for intermittent difficulty seeing (reading), negative for chest pain, dyspnea, urinary complaints.   Objective Vital Signs reviewed BP 120/72   Pulse (!) 53    Ht 5\' 2"  (1.575 m)   Wt 188 lb (85.3 kg)   SpO2 99%   BMI 34.39 kg/m  HEENT:  Diffuse alopecia in androgenic pattern  Small hyperpigmented papule with stuck on appearance on top of  Neck: Supple Cardiac: Regular rate and rhythm. Normal S1/S2. No murmurs, rubs, or gallops appreciated. Lungs: Clear bilaterally to ascultation.  Abdomen: Normoactive bowel sounds.+ fibroid uterus palpable in abdomen No rebound or guarding  Extremities: Warm, well perfused without edema.  Skin: Warm, dry Psych: Pleasant and appropriate    Assessments/Plans  Essential hypertension Previously on atenolol, discussed. BP at goal. Has had significant weight loss. Will trial off for 1 month, monitor BP at home.  BMET today.   HLD Repeat lipid panel today.  Alopecia, non scarring, likely androgenic, does have two small hyperpigmented, waxy, stuck on appearing papules (Seborrheic keratosis, not likely nevi). Discussed, check ferritin, CBC, TSH, consider minoxidil, referred to Dermatology per patients request.  Hyperandrogenism, noted by excessive facial hair, possibly worsened with perimenopausal state.   Abdominal cramping, differential is broad including IBS, possibly bulk symptoms related to fibroid uterus. No red flags concerning for CRC. Recommended colonoscopy, patient wishes to wait. Will try miralax as stools are hard in appearance. CBC, Hepatic function today. Consider repeat TVUS in future.   HCM Due for HIV, RPR, Hep C Mammogram at follow up Discuss colonoscopy again Patient request DEXA, will check vitamin D level as she has been on supplementation   Annual exam Encouraged more physical activity  We discussed healthy eating habits, moderate physical activity  for 30 minutes 5 times per week (or 150 minutes), safe sex practices, avoiding tobacco products, safe alcohol consumption, and safe driving habits. Will check Herbalife components    See after visit summary for details of patient  instructions  Dorris Singh, MD  [facility] Medicine Teaching Service

## 2019-11-18 NOTE — Patient Instructions (Addendum)
It was wonderful to see you today.  Thank you for choosing [facility].   Please call [phone] with any questions about today's appointment.  Please be sure to schedule follow up at the front  desk before you leave today.   Dorris Singh, MD  Family Medicine    1. Stop your atenolol 2. Go to the lab today  3. I will call you with results 4. Please schedule follow up in 1 month with me    For vaginal dryness---you can try Replens over the counter For yogurt--- avoid those with 'fake sugar  Congratulations on your weight loss  I will place a referral to the eye doctor and dermatologist--you will be called in about 2 weeks with an appointment

## 2019-11-19 ENCOUNTER — Telehealth: Payer: Self-pay | Admitting: Family Medicine

## 2019-11-19 DIAGNOSIS — L659 Nonscarring hair loss, unspecified: Secondary | ICD-10-CM

## 2019-11-19 DIAGNOSIS — E559 Vitamin D deficiency, unspecified: Secondary | ICD-10-CM

## 2019-11-19 LAB — LIPID PANEL
Chol/HDL Ratio: 3.8 ratio (ref 0.0–4.4)
Cholesterol, Total: 210 mg/dL — ABNORMAL HIGH (ref 100–199)
HDL: 56 mg/dL (ref >39)
LDL Chol Calc (NIH): 136 mg/dL — ABNORMAL HIGH (ref 0–99)
Triglycerides: 102 mg/dL (ref 0–149)
VLDL Cholesterol Cal: 18 mg/dL (ref 5–40)

## 2019-11-19 LAB — BASIC METABOLIC PANEL
BUN/Creatinine Ratio: 17 (ref 9–23)
BUN: 14 mg/dL (ref 6–24)
CO2: 20 mmol/L (ref 20–29)
Calcium: 9.1 mg/dL (ref 8.7–10.2)
Chloride: 106 mmol/L (ref 96–106)
Creatinine, Ser: 0.84 mg/dL (ref 0.57–1.00)
GFR calc Af Amer: 92 mL/min/{1.73_m2} (ref >59)
GFR calc non Af Amer: 80 mL/min/{1.73_m2} (ref >59)
Glucose: 93 mg/dL (ref 65–99)
Potassium: 4.6 mmol/L (ref 3.5–5.2)
Sodium: 141 mmol/L (ref 134–144)

## 2019-11-19 LAB — FERRITIN: Ferritin: 18 ng/mL (ref 15–150)

## 2019-11-19 LAB — CBC
Hematocrit: 39.4 % (ref 34.0–46.6)
Hemoglobin: 12.6 g/dL (ref 11.1–15.9)
MCH: 27.2 pg (ref 26.6–33.0)
MCHC: 32 g/dL (ref 31.5–35.7)
MCV: 85 fL (ref 79–97)
Platelets: 287 10*3/uL (ref 150–450)
RBC: 4.64 x10E6/uL (ref 3.77–5.28)
RDW: 13.8 % (ref 11.7–15.4)
WBC: 6.5 10*3/uL (ref 3.4–10.8)

## 2019-11-19 LAB — HEPATITIS C ANTIBODY (REFLEX): HCV Ab: <0.1 s/co ratio (ref 0.0–0.9)

## 2019-11-19 LAB — HEPATIC FUNCTION PANEL
ALT: 19 IU/L (ref 0–32)
AST: 17 IU/L (ref 0–40)
Albumin: 4.5 g/dL (ref 3.8–4.9)
Alkaline Phosphatase: 80 IU/L (ref 39–117)
Bilirubin Total: 0.3 mg/dL (ref 0.0–1.2)
Bilirubin, Direct: 0.09 mg/dL (ref 0.00–0.40)
Total Protein: 7.4 g/dL (ref 6.0–8.5)

## 2019-11-19 LAB — RPR: RPR Ser Ql: NONREACTIVE

## 2019-11-19 LAB — VITAMIN D 25 HYDROXY (VIT D DEFICIENCY, FRACTURES): Vit D, 25-Hydroxy: 29.8 ng/mL — ABNORMAL LOW (ref 30.0–100.0)

## 2019-11-19 LAB — HEMOGLOBIN A1C
Est. average glucose Bld gHb Est-mCnc: 100 mg/dL
Hgb A1c MFr Bld: 5.1 % (ref 4.8–5.6)

## 2019-11-19 LAB — TSH: TSH: 3.79 u[IU]/mL (ref 0.450–4.500)

## 2019-11-19 LAB — HIV ANTIBODY (ROUTINE TESTING W REFLEX): HIV Screen 4th Generation wRfx: NONREACTIVE

## 2019-11-19 LAB — HCV COMMENT:

## 2019-11-19 MED ORDER — CHOLECALCIFEROL 25 MCG (1000 UT) PO TABS: 1000.0000 [IU] | ORAL_TABLET | Freq: Every day | ORAL | 3 refills | Status: DC

## 2019-11-19 MED ORDER — MINOXIDIL 5 % EX FOAM: CUTANEOUS | 3 refills | Status: DC

## 2019-11-19 NOTE — Telephone Encounter (Signed)
Called patient with results, declined interpreter. ASCVD is 1.3%, no indication for statin.  Vitamin D slightly low, continue high dose then transition to 1000 IU daily. All other questions answered.  Needs appointment in April.   Dorris Singh, MD  Family Medicine Teaching Service

## 2019-11-22 NOTE — Telephone Encounter (Signed)
Red team--can you call Katelyn Fields and help schedule follow up with me later in April.  Thanks, [employer]

## 2019-11-25 ENCOUNTER — Telehealth: Payer: Self-pay

## 2019-11-25 NOTE — Telephone Encounter (Signed)
Called patient using [employer] ID # [id number].  Patient made appointment for 12/20/2019 at 0830 per Dr. Owens Shark for follow up.  Ozella Almond, [place]

## 2019-12-03 ENCOUNTER — Telehealth: Payer: Self-pay | Admitting: Family Medicine

## 2019-12-03 MED ORDER — VITAMIN D (ERGOCALCIFEROL) 1.25 MG (50000 UNIT) PO CAPS: 50000.0000 [IU] | ORAL_CAPSULE | ORAL | 0 refills | Status: DC

## 2019-12-03 NOTE — Telephone Encounter (Signed)
Called patient to ensure follow up scheduled. Vitamin D resent to pharmacy.  Dorris Singh, MD  Family Medicine Teaching Service

## 2019-12-20 ENCOUNTER — Other Ambulatory Visit: Payer: Self-pay

## 2019-12-20 ENCOUNTER — Ambulatory Visit (INDEPENDENT_AMBULATORY_CARE_PROVIDER_SITE_OTHER): Payer: 59 | Admitting: Family Medicine

## 2019-12-20 ENCOUNTER — Encounter: Payer: Self-pay | Admitting: Family Medicine

## 2019-12-20 VITALS — BP 122/60 | HR 60 | Ht 61.81 in | Wt 188.8 lb

## 2019-12-20 DIAGNOSIS — Z1231 Encounter for screening mammogram for malignant neoplasm of breast: Secondary | ICD-10-CM

## 2019-12-20 DIAGNOSIS — L659 Nonscarring hair loss, unspecified: Secondary | ICD-10-CM | POA: Diagnosis not present

## 2019-12-20 DIAGNOSIS — I10 Essential (primary) hypertension: Secondary | ICD-10-CM

## 2019-12-20 DIAGNOSIS — J302 Other seasonal allergic rhinitis: Secondary | ICD-10-CM

## 2019-12-20 MED ORDER — MINOXIDIL 5 % EX FOAM: CUTANEOUS | 3 refills | Status: DC

## 2019-12-20 MED ORDER — VITAMIN D (ERGOCALCIFEROL) 1.25 MG (50000 UNIT) PO CAPS: 50000.0000 [IU] | ORAL_CAPSULE | ORAL | 0 refills | Status: DC

## 2019-12-20 MED ORDER — FLUTICASONE PROPIONATE 50 MCG/ACT NA SUSP: 2.0000 | Freq: Every day | NASAL | 6 refills | Status: DC

## 2019-12-20 NOTE — Assessment & Plan Note (Signed)
No longer on medication---well controlled, asymptomatic.

## 2019-12-20 NOTE — Patient Instructions (Addendum)
   [facility]  Address: [address] [place], [place] [address] [phone]  [facility]  It was wonderful to see you today.  Please bring ALL of your medications with you to every visit.   Thank you for choosing [facility].   Please call [phone] with any questions about today's appointment.  Please be sure to schedule follow up at the front  desk before you leave today.   Dorris Singh, MD  Family Medicine

## 2019-12-20 NOTE — Progress Notes (Signed)
    SUBJECTIVE:   CHIEF COMPLAINT / HPI:  Katelyn Fields is a pleasant 54 year old with history of hypertension, now diet controlled. She declined an interpreter today.   She presents today for follow up. She has several concerns.  Abdominal fullness Reports constipation resolved with dried figs and prunes. Endorses some intermittent left sided pain prior to menses. No nausea, vomiting, weight loss. Menses are regular, heavy on first 2 days only. She has not yet scheduled with Ob-Gyn. Has colonoscopy scheduled.   Eye fullness Patient reports 'dark spots' under eyes. More noticeable in last few weeks. Washes mask daily. Has seasonal allergies that are acting up. No new soaps or detergents.   She continues on Herbalife. 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   PERTINENT  PMH / PSH/Family/Social History :  HTN- previously on atenolol  History of leiomyomas   OBJECTIVE:   BP 122/60   Pulse 60   Ht 5' 1.81" (1.57 m)   Wt 188 lb 12.8 oz (85.6 kg)   SpO2 98%   BMI 34.74 kg/m   HEENT: Sclera anicteric. Dentition is moderate. Appears well hydrated. Enlarged infraorbital skin with darkening  Neck: Supple Cardiac: Regular rate and rhythm. Normal S1/S2. No murmurs, rubs, or gallops appreciated. Lungs: Clear bilaterally to ascultation.  Abdomen: Normoactive bowel sounds. No tenderness to deep or light palpation. No rebound or guarding, + bulk symptoms with palpable suspected fibroid  Extremities: Warm, well perfused without edema.  Skin: Warm, dry Psych: Pleasant and appropriate   ASSESSMENT/PLAN:   Essential hypertension No longer on medication---well controlled, asymptomatic.    Allergic rhinitis, discussed allergic shiners, Rx for fluticasone.   Medication concerns, paper Rx given for prescriptions.  HCM Has CSY ordered Tdap Rx at next visit   Dorris Singh, [place]

## 2020-07-03 ENCOUNTER — Other Ambulatory Visit: Payer: Self-pay

## 2020-07-03 ENCOUNTER — Ambulatory Visit (INDEPENDENT_AMBULATORY_CARE_PROVIDER_SITE_OTHER): Payer: 59 | Admitting: Obstetrics and Gynecology

## 2020-07-03 ENCOUNTER — Other Ambulatory Visit (HOSPITAL_COMMUNITY)
Admission: RE | Admit: 2020-07-03 | Discharge: 2020-07-03 | Disposition: A | Discharge status: Home / Self Care | Payer: 59 | Source: Ambulatory Visit | Attending: Obstetrics and Gynecology | Admitting: Obstetrics and Gynecology

## 2020-07-03 ENCOUNTER — Encounter: Payer: Self-pay | Admitting: Obstetrics and Gynecology

## 2020-07-03 VITALS — BP 147/72 | HR 69 | Ht 62.21 in | Wt 194.0 lb

## 2020-07-03 DIAGNOSIS — R102 Pelvic and perineal pain unspecified side: Secondary | ICD-10-CM

## 2020-07-03 DIAGNOSIS — Z23 Encounter for immunization: Secondary | ICD-10-CM | POA: Diagnosis not present

## 2020-07-03 DIAGNOSIS — Z1231 Encounter for screening mammogram for malignant neoplasm of breast: Secondary | ICD-10-CM

## 2020-07-03 DIAGNOSIS — Z01419 Encounter for gynecological examination (general) (routine) without abnormal findings: Secondary | ICD-10-CM

## 2020-07-03 NOTE — Progress Notes (Signed)
Subjective:     Katelyn Fields is a 54 y.o. female P0 with BMI 35 and LMP 06/20/2020 who is here for a comprehensive physical exam. The patient reports no problems. Patient reports an irregular menstrual cycle lasting 7 days and at times heavy in flow. Patient reports often skipping 2 months followed by a heavier period and dysmenorrhea. She reports that she recently started to have irregular periods, previously they would occur monthly. She reports some occasional vasomotor symptoms. She is sexually active without complaints. She denies urinary incontinence. She is otherwise without complaints  Past Medical History:  Diagnosis Date  . HLD (hyperlipidemia)   . Hypertension    Past Surgical History:  Procedure Laterality Date  . ABDOMINAL SURGERY    . OVARIAN CYST SURGERY     Family History  Problem Relation Age of Onset  . Hypertension Mother   . Hypertension Father   . Hypertension Sister   . Asthma Sister   . Diabetes Brother     Social History   Socioeconomic History  . Marital status: Married    Spouse name: Not on file  . Number of children: Not on file  . Years of education: Not on file  . Highest education level: Not on file  Occupational History  . Not on file  Tobacco Use  . Smoking status: Never Smoker  . Smokeless tobacco: Never Used  Substance and Sexual Activity  . Alcohol use: No  . Drug use: No  . Sexual activity: Yes    Birth control/protection: None  Other Topics Concern  . Not on file  Social History Narrative   Works for [employer] active with husband- married in 2001    Social Determinants of Health   Financial Resource Strain:   . Difficulty of Paying Living Expenses: Not on file  Food Insecurity:   . Worried About Charity fundraiser in the Last Year: Not on file  . Ran Out of Food in the Last Year: Not on file  Transportation Needs:   . Lack of Transportation (Medical): Not on file  . Lack of Transportation (Non-Medical): Not on file   Physical Activity:   . Days of Exercise per Week: Not on file  . Minutes of Exercise per Session: Not on file  Stress:   . Feeling of Stress : Not on file  Social Connections:   . Frequency of Communication with Friends and Family: Not on file  . Frequency of Social Gatherings with Friends and Family: Not on file  . Attends Religious Services: Not on file  . Active Member of Clubs or Organizations: Not on file  . Attends Archivist Meetings: Not on file  . Marital Status: Not on file  Intimate Partner Violence:   . Fear of Current or Ex-Partner: Not on file  . Emotionally Abused: Not on file  . Physically Abused: Not on file  . Sexually Abused: Not on file   Health Maintenance  Topic Date Due  . COVID-19 Vaccine (1) Never done  . TETANUS/TDAP  Never done  . COLONOSCOPY  Never done  . MAMMOGRAM  12/30/2019  . INFLUENZA VACCINE  Never done  . PAP SMEAR-Modifier  10/28/2020  . Hepatitis C Screening  Completed  . HIV Screening  Completed       Review of Systems Pertinent items noted in HPI and remainder of comprehensive ROS otherwise negative.   Objective:  Blood pressure (!) 147/72, pulse 69, height 5' 2.21" (1.58 m),  weight 194 lb (88 kg), last menstrual period 06/20/2020.     GENERAL: Well-developed, well-nourished female in no acute distress.  HEENT: Normocephalic, atraumatic. Sclerae anicteric.  NECK: Supple. Normal thyroid.  LUNGS: Clear to auscultation bilaterally.  HEART: Regular rate and rhythm. BREASTS: Symmetric in size. No palpable masses or lymphadenopathy, skin changes, or nipple drainage. ABDOMEN: Soft, nontender, nondistended. No organomegaly. PELVIC: Normal external female genitalia. Vagina is pink and rugated.  Normal discharge. Normal appearing cervix. Uterus is14-weeks in size. No adnexal mass or tenderness. EXTREMITIES: No cyanosis, clubbing, or edema, 2+ distal pulses.    Assessment:    Healthy female exam.      Plan:    Pap smear  collected Screening mammogram ordered Pelvic ultrasound ordered to assess fibroid uterus and pelvic pain Patient will be contacted with abnormal results Flu vaccine today See After Visit Summary for Counseling Recommendations

## 2020-07-07 LAB — CYTOLOGY - PAP
Adequacy: ABSENT
Comment: NEGATIVE
Diagnosis: NEGATIVE
High risk HPV: NEGATIVE

## 2020-07-16 ENCOUNTER — Other Ambulatory Visit: Payer: Self-pay

## 2020-07-16 ENCOUNTER — Ambulatory Visit
Admission: RE | Admit: 2020-07-16 | Discharge: 2020-07-16 | Disposition: A | Discharge status: Home / Self Care | Payer: 59 | Source: Ambulatory Visit | Attending: Obstetrics and Gynecology | Admitting: Obstetrics and Gynecology

## 2020-07-16 DIAGNOSIS — R102 Pelvic and perineal pain: Secondary | ICD-10-CM | POA: Insufficient documentation

## 2020-08-04 ENCOUNTER — Other Ambulatory Visit: Payer: Self-pay

## 2020-08-04 ENCOUNTER — Encounter: Payer: Self-pay | Admitting: Family Medicine

## 2020-08-04 ENCOUNTER — Ambulatory Visit (INDEPENDENT_AMBULATORY_CARE_PROVIDER_SITE_OTHER): Payer: 59 | Admitting: Family Medicine

## 2020-08-04 VITALS — BP 130/82 | HR 57 | Wt 199.4 lb

## 2020-08-04 DIAGNOSIS — Z1231 Encounter for screening mammogram for malignant neoplasm of breast: Secondary | ICD-10-CM

## 2020-08-04 DIAGNOSIS — E785 Hyperlipidemia, unspecified: Secondary | ICD-10-CM | POA: Diagnosis not present

## 2020-08-04 DIAGNOSIS — I10 Essential (primary) hypertension: Secondary | ICD-10-CM | POA: Diagnosis not present

## 2020-08-04 DIAGNOSIS — F5101 Primary insomnia: Secondary | ICD-10-CM

## 2020-08-04 DIAGNOSIS — R030 Elevated blood-pressure reading, without diagnosis of hypertension: Secondary | ICD-10-CM

## 2020-08-04 DIAGNOSIS — R351 Nocturia: Secondary | ICD-10-CM

## 2020-08-04 DIAGNOSIS — Z1211 Encounter for screening for malignant neoplasm of colon: Secondary | ICD-10-CM

## 2020-08-04 DIAGNOSIS — E559 Vitamin D deficiency, unspecified: Secondary | ICD-10-CM | POA: Diagnosis not present

## 2020-08-04 MED ORDER — MELATONIN 3 MG PO CAPS: ORAL_CAPSULE | ORAL | 3 refills | Status: AC

## 2020-08-04 NOTE — Progress Notes (Signed)
    SUBJECTIVE:   CHIEF COMPLAINT / HPI:   Jadelynn Boylan is a pleasant 54 year old with history of alopecia, elevated BMI, leiomyomas of the uterus presenting for follow up.   Weight Gain Patient reports increased stress in life. She has numerous new clients at work, stress at home with neighbors. She reports eating gluten again. No polyuria, polydipsia. Still exercising regularly.   Leiomyomas  Patient reports continued bulk symptoms. Has relatively irregular periods, moderate flow. No symptoms of anemia such as dyspnea, chest pain, palpitations. She is interested in medical therapy or possibly myomectomy.   Elevated Blood Pressure Reading Patient previously took atenolol. She has been off of this medication. She does worry her blood pressure will be high at night as she sometimes has a headache later in the day. Would prefer to avoid medication and make dietary changes but wants to ensure BP is staying in range.   Sleep  The patient reports ongoing sleep problems. Gets home from work and is very tired. She goes to bed around 930 to 10 PM wakes up at 1 AM due to noisy neighbors. Has tried headphones. No excess anxiety, depression. Denies low mood. Reports relationship with husband is good. Interested in natural medication. Cannot move due to pending citizenship. No EtOH or caffeine use. Does use bathroom in PM due to 'full bladder'.   PERTINENT  PMH / PSH/Family/Social History : Reviewed and updated.   OBJECTIVE:   BP 130/82   Pulse (!) 57   Wt 199 lb 6.4 oz (90.4 kg)   SpO2 98%   BMI 36.23 kg/m   Today's weight:  Last Weight  Most recent update: 08/04/2020 11:11 AM   Weight  90.4 kg (199 lb 6.4 oz)           Review of prior weights: Filed Weights   08/04/20 1111  Weight: 199 lb 6.4 oz (90.4 kg)    Cardiac: Regular rate and rhythm. Normal S1/S2. No murmurs, rubs, or gallops appreciated. Lungs: Clear bilaterally to ascultation.  Abdomen: Normoactive bowel sounds. Palpable  fibroids in RLQ. No tenderness to deep or light palpation. No rebound or guarding.    Psych: Pleasant and appropriate  HEENT Mild diffuse alopecia + excess hairs on submandibular location    ASSESSMENT/PLAN:   Essential hypertension Discussed at length--BP at goal but does have nocturnal symptoms. Scheduled with Dr. Valentina Lucks for 24 hour monitor.   Dyslipidemia Direct LDL ordered.    Leiomyoma, symptomatic, encouraged to reach out to Dr. Elly Modena to discuss management, patient most interested medications.  Alopecia, with weight gain, concern for mild hirsutism, TSH ordered.   Hypovitaminosis D, previously treated, due for level, ordered. Chronic, stable.   Weight Gain, discussed stress management, dietary changes, A1C ordered.  Insomnia, discussed sleep hygiene, trial melatonin.  HCM FIT ordered.  Mammogram scheduled.   Dorris Singh, [place]

## 2020-08-04 NOTE — Patient Instructions (Addendum)
It was wonderful to see you today.  Please bring ALL of your medications with you to every visit.   Today we talked about:  Calling Dr. Domenic Schwab office about treatment options for your fibroids  Address: [address], [place], [place] [address] Phone: [phone]   I will call you with blood work results  Try melatonin for sleep  Follow up with Dr.Koval for your 24 hour blood pressure check up       Thank you for choosing [facility].   Please call [phone] with any questions about today's appointment.  Please be sure to schedule follow up at the front  desk before you leave today.   Dorris Singh, MD  Family Medicine

## 2020-08-04 NOTE — Assessment & Plan Note (Signed)
Direct LDL ordered.

## 2020-08-04 NOTE — Assessment & Plan Note (Signed)
Discussed at length--BP at goal but does have nocturnal symptoms. Scheduled with Dr. Valentina Lucks for 24 hour monitor.

## 2020-08-05 ENCOUNTER — Telehealth: Payer: Self-pay | Admitting: Family Medicine

## 2020-08-05 DIAGNOSIS — E559 Vitamin D deficiency, unspecified: Secondary | ICD-10-CM

## 2020-08-05 LAB — TSH: TSH: 4.36 u[IU]/mL (ref 0.450–4.500)

## 2020-08-05 LAB — VITAMIN D 25 HYDROXY (VIT D DEFICIENCY, FRACTURES): Vit D, 25-Hydroxy: 22.2 ng/mL — ABNORMAL LOW (ref 30.0–100.0)

## 2020-08-05 LAB — HEMOGLOBIN A1C
Est. average glucose Bld gHb Est-mCnc: 103 mg/dL
Hgb A1c MFr Bld: 5.2 % (ref 4.8–5.6)

## 2020-08-05 LAB — LDL CHOLESTEROL, DIRECT: LDL Direct: 133 mg/dL — ABNORMAL HIGH (ref 0–99)

## 2020-08-05 MED ORDER — CHOLECALCIFEROL 25 MCG (1000 UT) PO TABS: 1000.0000 [IU] | ORAL_TABLET | Freq: Every day | ORAL | 3 refills | Status: DC

## 2020-08-05 NOTE — Telephone Encounter (Signed)
Attempted to call X1.   Thyroid is normal. Vitamin D is a bit low-continue supplement A1C_ normal  LDL (Cholesterol) is actually BETTER than last time  Will try again later.  Dorris Singh, MD  Family Medicine Teaching Service

## 2020-08-05 NOTE — Telephone Encounter (Signed)
Called, Rx for Vitamin D.   Discussed 11/30 appointment again for BP cuff.  Dorris Singh, MD  Family Medicine Teaching Service

## 2020-08-05 NOTE — Telephone Encounter (Signed)
Patient called back returning Dr. Roosvelt Harps call. I informed patient that Dr. Owens Shark would call her back.

## 2020-08-05 NOTE — Addendum Note (Signed)
Addended by: Owens Shark, Katelen Luepke on: 08/05/2020 09:34 AM   Modules accepted: Orders

## 2020-08-11 ENCOUNTER — Ambulatory Visit (INDEPENDENT_AMBULATORY_CARE_PROVIDER_SITE_OTHER): Payer: 59 | Admitting: Pharmacist

## 2020-08-11 ENCOUNTER — Other Ambulatory Visit: Payer: Self-pay

## 2020-08-11 DIAGNOSIS — I10 Essential (primary) hypertension: Secondary | ICD-10-CM | POA: Diagnosis not present

## 2020-08-11 NOTE — Progress Notes (Signed)
S:    Patient arrives in good spirits ambulating well. Presents to the clinic for ambulatory blood pressure evaluation.  Interview conducted with video interpreter:  Hinton Dyer [phone] on Day #1 and Stanton Kidney on Day #2 Patient was referred and last seen by Primary Care Provider, Dr. Owens Shark on 08/04/2020.   Pt states  She met with Dr. Owens Shark in March and at that time she was losing weigh, and eating well and at that time she stopped atenolol. Pt endorses headaches at night and being under a lot of strain at work. Pt wishes to monitor her blood pressure to make sure she is in range. Pt does not remember the names of the medications she has taken in the past for HTN. Pt states she was previously on her BP medication when she arrived to the [place]. Endorses not checking BP at home.   Diagnosed with Hypertension while in [place] during a period of high stress.   Discussed procedure for wearing the monitor and gave patient written instructions. Monitor was placed on non-dominant arm with instructions to return in the morning.   Current BP Medications include:  Pt is not currently taking any medications for blood pressure.   Antihypertensives tried in the past include: atenolol  Dietary habits include:  Reported no issues with BP monitor use overnight however did have complaint of arm being squeezed very hard.  Mild bruising noted by nurse when cuff was removed.   O:   Physical Exam Constitutional:      Appearance: Normal appearance. She is normal weight.  Pulmonary:     Effort: Pulmonary effort is normal.  Musculoskeletal:     Right lower leg: No edema.     Left lower leg: No edema.  Neurological:     Mental Status: She is alert.  Psychiatric:        Mood and Affect: Mood normal.        Behavior: Behavior normal.        Thought Content: Thought content normal.    Review of Systems  Cardiovascular: Negative for leg swelling.  Neurological: Positive for headaches.  Psychiatric/Behavioral:  The patient has insomnia.   All other systems reviewed and are negative.   Last 3 Office BP readings: BP Readings from Last 3 Encounters:  08/04/20 130/82  07/03/20 (!) 147/72  12/20/19 122/60     Clinical Atherosclerotic Cardiovascular Disease (ASCVD): No  The 10-year ASCVD risk score Mikey Bussing DC Jr., et al., 2013) is: 1.9%   Values used to calculate the score:     Age: 54 years     Sex: Female     Is Non-Hispanic African American: No     Diabetic: No     Tobacco smoker: No     Systolic Blood Pressure: 062 mmHg     Is BP treated: No     HDL Cholesterol: 56 mg/dL     Total Cholesterol: 210 mg/dL  Basic Metabolic Panel    Component Value Date/Time   NA 141 11/18/2019 1234   K 4.6 11/18/2019 1234   CL 106 11/18/2019 1234   CO2 20 11/18/2019 1234   GLUCOSE 93 11/18/2019 1234   GLUCOSE 102 12/23/2016 1237   BUN 14 11/18/2019 1234   CREATININE 0.84 11/18/2019 1234   CALCIUM 9.1 11/18/2019 1234   GFRNONAA 80 11/18/2019 1234   GFRAA 92 11/18/2019 1234     ABPM Study Data: Arm Placement left arm   Overall Mean 24hr BP:   118/61 mmHg HR:  67   Daytime Mean BP:  124/64 mmHg HR: 69   Nighttime Mean BP:  106/55 mmHg HR: 63   Dipping Pattern: Yes.    Sys:   14.9%   Dia: 14.5%   [normal dipping ~10-20%]  Non-hypertensive ABPM thresholds: daytime BP <125/75 mmHg, sleeptime BP <120/70 mmHg    A/P: History of elevated blood pressure while in [place] several years ago, recently stopped previously prescribed beta-blocker, atenolol. 24-hour ambulatory blood pressure results demonstrate excellent control with an average awake blood pressure of 124/64 mmHg, and a nocturnal dipping pattern that is normal.   No medications initiation or adjustment at this time.   Results of the evaluation was reviewed, and cause of "tight squeezing" of arm was most likely related to excellent blood pressure control and the device attempting to search for higher readings when it could not sense low  readings was shared.    Results reviewed and patient verbalized understanding.  Patient was happy to hear good news that she could avoid taking any blood pressure medication.  Total time in face-to-face counseling 25 minutes.   F/U Clinic Visit with Dr. Owens Shark in the next 2-6 weeks.  Patient seen with Adria Dill, PharmD Candidate, and Dimple Nanas, PharmD - PGY-1 Resident.

## 2020-08-11 NOTE — Patient Instructions (Signed)
Wearing the Blood Pressure Monitor  The cuff will inflate every 20 minutes during the day and every 30 minutes while you sleep.  Your blood pressure readings will NOT display after cuff inflation  Fill out the blood pressure-activity diary during the day, especially during activities that may affect your reading -- such as exercise, stress, walking, taking your blood pressure medications  Important things to know:  Avoid taking the monitor off for the next 24 hours, unless it causes you discomfort or pain.  Do NOT get the monitor wet and do NOT dry to clean the monitor with any cleaning products.  Do NOT put the monitor on anyone else's arm.  When the cuff inflates, avoid excess movement. Let the cuffed arm hang loosely, slightly away from the body. Avoid flexing the muscles or moving the hand/fingers.  When you go to sleep, make sure that the hose is not kinked.  Remember to fill out the blood pressure activity diary.  If you experience severe pain or unusual pain (not associated with getting your blood pressure checked), remove the monitor.           20     30   .                         -           .     :     24              .          .        .       .            .       / .          .       .           (    )    .   Troubleshooting:  Code  Troubleshooting   1   Check cuff position, tighten cuff   2, 3  Remain still during reading   4, 87  Check air hose connections and make sure cuff is tight   85, 89  Check hose connections and make tubing is not crimped   86  Push START/STOP to restart reading   88, 91  Retry by pushing START/STOP   90  Replace batteries. If problem persists, remove monitor and bring back to   clinic at follow up   97, 98, 99  Service required - Remove monitor and bring back to clinic at follow up   Blood Pressure Activity Diary Time Lying down/ Sleeping  /  Walking/ Exercise  /  Stressed/ Angry  /  Headache/ Pain  /  Dizzy   9 AM       10 AM       11 AM       12 PM       1 PM       2 PM       Time Lying down/ Sleeping  /  Walking/ Exercise  /  Stressed/ Angry  /  Headache/ Pain  /  Dizzy   3 PM       4 PM        5 PM       6 PM       7 PM  8 PM       Time Lying down/ Sleeping  /  Walking/ Exercise  /  Stressed/ Angry  /  Headache/ Pain  /  Dizzy   9 PM       10 PM       11 PM       12 AM       1 AM       2 AM       3 AM       Time Lying down/ Sleeping  /  Walking/ Exercise  /  Stressed/ Angry  /  Headache/ Pain  /  Dizzy   4 AM       5 AM       6 AM       7 AM       8 AM       9 AM       10 AM           Come back tomorrow at  to have the monitor removed-       Call the [facility] if you have any questions before then [phone])

## 2020-08-12 ENCOUNTER — Ambulatory Visit
Admission: RE | Admit: 2020-08-12 | Discharge: 2020-08-12 | Disposition: A | Discharge status: Home / Self Care | Payer: 59 | Source: Ambulatory Visit | Attending: Obstetrics and Gynecology | Admitting: Obstetrics and Gynecology

## 2020-08-12 ENCOUNTER — Encounter: Payer: Self-pay | Admitting: Pharmacist

## 2020-08-12 DIAGNOSIS — Z1231 Encounter for screening mammogram for malignant neoplasm of breast: Secondary | ICD-10-CM

## 2020-08-12 NOTE — Assessment & Plan Note (Signed)
History of elevated blood pressure while in [place] several years ago, recently stopped previously prescribed beta-blocker, atenolol. 24-hour ambulatory blood pressure results demonstrate excellent control with an average awake blood pressure of 124/64 mmHg, and a nocturnal dipping pattern that is normal.   No medications initiation or adjustment at this time.

## 2020-08-12 NOTE — Progress Notes (Signed)
Reviewed: I agree with Dr. Koval's documentation and management. 

## 2020-08-13 ENCOUNTER — Encounter: Payer: Self-pay | Admitting: Family Medicine

## 2021-05-17 ENCOUNTER — Other Ambulatory Visit: Payer: Self-pay

## 2021-05-17 ENCOUNTER — Ambulatory Visit (HOSPITAL_COMMUNITY)
Admission: EM | Admit: 2021-05-17 | Discharge: 2021-05-17 | Disposition: A | Discharge status: Home / Self Care | Payer: 59 | Attending: Family Medicine | Admitting: Family Medicine

## 2021-05-17 ENCOUNTER — Encounter (HOSPITAL_COMMUNITY): Payer: Self-pay | Admitting: *Deleted

## 2021-05-17 DIAGNOSIS — K0889 Other specified disorders of teeth and supporting structures: Secondary | ICD-10-CM | POA: Diagnosis not present

## 2021-05-17 MED ORDER — AMOXICILLIN-POT CLAVULANATE 875-125 MG PO TABS: 1.0000 | ORAL_TABLET | Freq: Two times a day (BID) | ORAL | 0 refills | Status: DC

## 2021-05-17 MED ORDER — LIDOCAINE VISCOUS HCL 2 % MT SOLN: 10.0000 mL | OROMUCOSAL | 0 refills | Status: DC | PRN

## 2021-05-17 NOTE — ED Provider Notes (Signed)
[facility]    CSN: [id number] Arrival date & time: 05/17/21  1113      History   Chief Complaint Chief Complaint  Patient presents with   Dental Pain    HPI Katelyn Fields is a 55 y.o. female.   Presenting today with 3-day history of left upper dental pain, swelling.  Denies dysphagia, difficulty breathing, fevers, chills, drainage from the area.  No known history of injury to the cheek.  States she has never had any dental issues in the past.  Has been unable to schedule an appoint with her dentist as it was the weekend and now a holiday.  She has been trying over-the-counter pain relievers and salt water gargles with no relief.   Past Medical History:  Diagnosis Date   HLD (hyperlipidemia)    Hypertension     Patient Active Problem List   Diagnosis Date Noted   Essential hypertension 11/18/2019   Dyslipidemia 11/18/2019   Healthcare maintenance 11/18/2019    Past Surgical History:  Procedure Laterality Date   ABDOMINAL SURGERY     OVARIAN CYST SURGERY      OB History     Gravida  4   Para      Term      Preterm      AB  4   Living         SAB  4   IAB      Ectopic      Multiple      Live Births               Home Medications    Prior to Admission medications   Medication Sig Start Date End Date Taking? Authorizing Provider  amoxicillin-clavulanate (AUGMENTIN) 875-125 MG tablet Take 1 tablet by mouth every 12 (twelve) hours. 05/17/21  Yes Volney American, PA-C  lidocaine (XYLOCAINE) 2 % solution Use as directed 10 mLs in the mouth or throat as needed for mouth pain. 05/17/21  Yes Volney American, PA-C  Cholecalciferol 25 MCG (1000 UT) tablet Take 1 tablet (1,000 Units total) by mouth daily. 08/05/20   Martyn Malay, MD  Melatonin 3 MG CAPS Take up to 3 capsules nightly 08/04/20   Martyn Malay, MD  Multiple Vitamin (MULTIVITAMIN ADULT PO) Take by mouth.    [provider]    Family History Family  History  Problem Relation Age of Onset   Hypertension Mother    Hypertension Father    Hypertension Sister    Asthma Sister    Diabetes Brother     Social History Social History   Tobacco Use   Smoking status: Never   Smokeless tobacco: Never  Substance Use Topics   Alcohol use: No   Drug use: No     Allergies   Patient has no known allergies.   Review of Systems Review of Systems Per HPI  Physical Exam Triage Vital Signs ED Triage Vitals  Enc Vitals Group     BP 05/17/21 1356 (!) 153/76     Pulse Rate 05/17/21 1356 (!) 59     Resp 05/17/21 1356 20     Temp 05/17/21 1356 98.1 F (36.7 C)     Temp src --      SpO2 05/17/21 1356 99 %     Weight --      Height --      Head Circumference --      Peak Flow --  Pain Score 05/17/21 1357 8     Pain Loc --      Pain Edu? --      Excl. in [place]? --    No data found.  Updated Vital Signs BP (!) 153/76   Pulse (!) 59   Temp 98.1 F (36.7 C)   Resp 20   LMP 04/30/2021 (Approximate)   SpO2 99%   Visual Acuity Right Eye Distance:   Left Eye Distance:   Bilateral Distance:    Right Eye Near:   Left Eye Near:    Bilateral Near:     Physical Exam Vitals and nursing note reviewed.  Constitutional:      Appearance: Normal appearance. She is not ill-appearing.  HENT:     Head: Atraumatic.     Mouth/Throat:     Mouth: Mucous membranes are moist.     Pharynx: Oropharynx is clear.     Comments: Gingival erythema and edema, decay and central portion of molar where her pain is described to be Eyes:     Extraocular Movements: Extraocular movements intact.     Conjunctiva/sclera: Conjunctivae normal.  Cardiovascular:     Rate and Rhythm: Normal rate and regular rhythm.     Heart sounds: Normal heart sounds.  Pulmonary:     Effort: Pulmonary effort is normal.     Breath sounds: Normal breath sounds.  Musculoskeletal:        General: Normal range of motion.     Cervical back: Normal range of motion and  neck supple.  Skin:    General: Skin is warm and dry.  Neurological:     Mental Status: She is alert and oriented to person, place, and time.  Psychiatric:        Mood and Affect: Mood normal.        Thought Content: Thought content normal.        Judgment: Judgment normal.     UC Treatments / Results  Labs (all labs ordered are listed, but only abnormal results are displayed) Labs Reviewed - No data to display  EKG   Radiology No results found.  Procedures Procedures (including critical care time)  Medications Ordered in UC Medications - No data to display  Initial Impression / Assessment and Plan / UC Course  I have reviewed the triage vital signs and the nursing notes.  Pertinent labs & imaging results that were available during my care of the patient were reviewed by me and considered in my medical decision making (see chart for details).     Will cover with Augmentin, viscous lidocaine, over-the-counter pain relievers, salt water gargles until she can follow-up with dentist this week.  Return for acutely worsening symptoms.  Final Clinical Impressions(s) / UC Diagnoses   Final diagnoses:  Pain, dental   Discharge Instructions   None    ED Prescriptions     Medication Sig Dispense Auth. Provider   amoxicillin-clavulanate (AUGMENTIN) 875-125 MG tablet Take 1 tablet by mouth every 12 (twelve) hours. 14 tablet Volney American, [place]   lidocaine (XYLOCAINE) 2 % solution Use as directed 10 mLs in the mouth or throat as needed for mouth pain. 100 mL Volney American, [place]      PDMP not reviewed this encounter.   Volney American, [place] 05/17/21 1436

## 2021-05-17 NOTE — ED Triage Notes (Signed)
Pt reports lrt upper dental pain since SAT.

## 2021-09-03 ENCOUNTER — Emergency Department (HOSPITAL_COMMUNITY): Payer: 59

## 2021-09-03 ENCOUNTER — Emergency Department (HOSPITAL_COMMUNITY)
Admission: EM | Admit: 2021-09-03 | Discharge: 2021-09-03 | Disposition: A | Discharge status: Home / Self Care | Payer: 59 | Attending: Emergency Medicine | Admitting: Emergency Medicine

## 2021-09-03 ENCOUNTER — Other Ambulatory Visit: Payer: Self-pay

## 2021-09-03 DIAGNOSIS — X58XXXA Exposure to other specified factors, initial encounter: Secondary | ICD-10-CM | POA: Insufficient documentation

## 2021-09-03 DIAGNOSIS — S39012A Strain of muscle, fascia and tendon of lower back, initial encounter: Secondary | ICD-10-CM

## 2021-09-03 DIAGNOSIS — Y92000 Kitchen of unspecified non-institutional (private) residence as  the place of occurrence of the external cause: Secondary | ICD-10-CM | POA: Diagnosis not present

## 2021-09-03 DIAGNOSIS — K219 Gastro-esophageal reflux disease without esophagitis: Secondary | ICD-10-CM

## 2021-09-03 DIAGNOSIS — I10 Essential (primary) hypertension: Secondary | ICD-10-CM | POA: Insufficient documentation

## 2021-09-03 DIAGNOSIS — M546 Pain in thoracic spine: Secondary | ICD-10-CM | POA: Diagnosis present

## 2021-09-03 DIAGNOSIS — S29012A Strain of muscle and tendon of back wall of thorax, initial encounter: Secondary | ICD-10-CM | POA: Insufficient documentation

## 2021-09-03 LAB — COMPREHENSIVE METABOLIC PANEL
ALT: 39 U/L (ref 0–44)
AST: 30 U/L (ref 15–41)
Albumin: 4 g/dL (ref 3.5–5.0)
Alkaline Phosphatase: 95 U/L (ref 38–126)
Anion gap: 12 (ref 5–15)
BUN: 9 mg/dL (ref 6–20)
CO2: 20 mmol/L — ABNORMAL LOW (ref 22–32)
Calcium: 9.1 mg/dL (ref 8.9–10.3)
Chloride: 106 mmol/L (ref 98–111)
Creatinine, Ser: 0.77 mg/dL (ref 0.44–1.00)
GFR, Estimated: >60 mL/min (ref >60)
Glucose, Bld: 110 mg/dL — ABNORMAL HIGH (ref 70–99)
Potassium: 3.8 mmol/L (ref 3.5–5.1)
Sodium: 138 mmol/L (ref 135–145)
Total Bilirubin: 0.5 mg/dL (ref 0.3–1.2)
Total Protein: 7.1 g/dL (ref 6.5–8.1)

## 2021-09-03 LAB — CBC WITH DIFFERENTIAL/PLATELET
Abs Immature Granulocytes: 0.03 10*3/uL (ref 0.00–0.07)
Basophils Absolute: 0.1 10*3/uL (ref 0.0–0.1)
Basophils Relative: 1 %
Eosinophils Absolute: 0.1 10*3/uL (ref 0.0–0.5)
Eosinophils Relative: 1 %
HCT: 39.1 % (ref 36.0–46.0)
Hemoglobin: 12.7 g/dL (ref 12.0–15.0)
Immature Granulocytes: 0 %
Lymphocytes Relative: 34 %
Lymphs Abs: 2.6 10*3/uL (ref 0.7–4.0)
MCH: 27.2 pg (ref 26.0–34.0)
MCHC: 32.5 g/dL (ref 30.0–36.0)
MCV: 83.7 fL (ref 80.0–100.0)
Monocytes Absolute: 0.6 10*3/uL (ref 0.1–1.0)
Monocytes Relative: 8 %
Neutro Abs: 4.1 10*3/uL (ref 1.7–7.7)
Neutrophils Relative %: 56 %
Platelets: 300 10*3/uL (ref 150–400)
RBC: 4.67 MIL/uL (ref 3.87–5.11)
RDW: 14.6 % (ref 11.5–15.5)
WBC: 7.4 10*3/uL (ref 4.0–10.5)
nRBC: 0 % (ref 0.0–0.2)

## 2021-09-03 LAB — TROPONIN I (HIGH SENSITIVITY)
Troponin I (High Sensitivity): 4 ng/L (ref <18)
Troponin I (High Sensitivity): 3 ng/L (ref <18)

## 2021-09-03 LAB — LIPASE, BLOOD: Lipase: 33 U/L (ref 11–51)

## 2021-09-03 MED ORDER — FAMOTIDINE 20 MG PO TABS: 20.0000 mg | ORAL_TABLET | Freq: Two times a day (BID) | ORAL | 0 refills | Status: DC

## 2021-09-03 MED ORDER — NAPROXEN 500 MG PO TABS: 500.0000 mg | ORAL_TABLET | Freq: Two times a day (BID) | ORAL | 0 refills | Status: DC

## 2021-09-03 MED ORDER — NAPROXEN 250 MG PO TABS
500.0000 mg | ORAL_TABLET | Freq: Once | ORAL | Status: DC
  Filled 2021-09-03: qty 2

## 2021-09-03 MED ORDER — CYCLOBENZAPRINE HCL 10 MG PO TABS: 10.0000 mg | ORAL_TABLET | Freq: Two times a day (BID) | ORAL | 0 refills | Status: DC | PRN

## 2021-09-03 NOTE — ED Triage Notes (Signed)
Pt c/o back pain since yesterday.

## 2021-09-03 NOTE — ED Provider Notes (Signed)
[facility] EMERGENCY DEPARTMENT Provider Note   CSN: [id number] Arrival date & time: 09/03/21  3846     History Chief Complaint  Patient presents with   Back Pain   Chest Pain    Katelyn Fields is a 55 y.o. female.   Back Pain Associated symptoms: chest pain   Chest Pain Associated symptoms: back pain    56 year old female presenting to the emergency department with back pain and chest pain.  The patient states that she was working in the kitchen yesterday when she felt a strain in her upper thoracic back on the left.  She endorses musculoskeletal soft tissue pain that is sharp, mild in intensity, nonradiating, worse with movement, better with rest.  She had an associated muscle spasm with the pain and took a muscle relaxant that she got from [place].  Following taking this medication, she had an episode of gastroesophageal reflux during which time she felt burning in her chest.  Symptoms have since resolved since coming to the emergency department.  She still endorses mild left upper back pain.  No other complaints at this time.  She denies any red flag back pain symptoms.  No recent falls, trauma, numbness, weakness, fever chills, bowel or bladder incontinence.  Past Medical History:  Diagnosis Date   HLD (hyperlipidemia)    Hypertension     Patient Active Problem List   Diagnosis Date Noted   Essential hypertension 11/18/2019   Dyslipidemia 11/18/2019   Healthcare maintenance 11/18/2019    Past Surgical History:  Procedure Laterality Date   ABDOMINAL SURGERY     OVARIAN CYST SURGERY       OB History     Gravida  4   Para      Term      Preterm      AB  4   Living         SAB  4   IAB      Ectopic      Multiple      Live Births              Family History  Problem Relation Age of Onset   Hypertension Mother    Hypertension Father    Hypertension Sister    Asthma Sister    Diabetes Brother     Social History    Tobacco Use   Smoking status: Never   Smokeless tobacco: Never  Substance Use Topics   Alcohol use: No   Drug use: No    Home Medications Prior to Admission medications   Medication Sig Start Date End Date Taking? Authorizing Provider  cyclobenzaprine (FLEXERIL) 10 MG tablet Take 1 tablet (10 mg total) by mouth 2 (two) times daily as needed for muscle spasms. 09/03/21  Yes Regan Lemming, MD  famotidine (PEPCID) 20 MG tablet Take 1 tablet (20 mg total) by mouth 2 (two) times daily. 09/03/21  Yes Regan Lemming, MD  naproxen (NAPROSYN) 500 MG tablet Take 1 tablet (500 mg total) by mouth 2 (two) times daily. 09/03/21  Yes Regan Lemming, MD  amoxicillin-clavulanate (AUGMENTIN) 875-125 MG tablet Take 1 tablet by mouth every 12 (twelve) hours. 05/17/21   Volney American, PA-C  Cholecalciferol 25 MCG (1000 UT) tablet Take 1 tablet (1,000 Units total) by mouth daily. 08/05/20   Martyn Malay, MD  lidocaine (XYLOCAINE) 2 % solution Use as directed 10 mLs in the mouth or throat as needed for mouth pain. 05/17/21   Merrie Roof  Benjamine Mola, PA-C  Melatonin 3 MG CAPS Take up to 3 capsules nightly 08/04/20   Martyn Malay, MD  Multiple Vitamin (MULTIVITAMIN ADULT PO) Take by mouth.    [provider]    Allergies    Patient has no known allergies.  Review of Systems   Review of Systems  Cardiovascular:  Positive for chest pain.  Musculoskeletal:  Positive for back pain.  All other systems reviewed and are negative.  Physical Exam Updated Vital Signs BP (!) 141/76 (BP Location: Right Arm)    Pulse 64    Temp 97.7 F (36.5 C) (Oral)    Resp 16    Ht 5\' 2"  (5.009 m)    Wt 91.2 kg    SpO2 100%    BMI 36.77 kg/m   Physical Exam Vitals and nursing note reviewed.  Constitutional:      General: She is not in acute distress.    Appearance: She is well-developed.  HENT:     Head: Normocephalic and atraumatic.  Eyes:     Conjunctiva/sclera: Conjunctivae normal.  Neck:      Comments: No midline tenderness of the cervical spine, range of motion intact. Cardiovascular:     Rate and Rhythm: Normal rate and regular rhythm.     Heart sounds: No murmur heard. Pulmonary:     Effort: Pulmonary effort is normal. No respiratory distress.     Breath sounds: Normal breath sounds.  Abdominal:     Palpations: Abdomen is soft.     Tenderness: There is no abdominal tenderness.  Musculoskeletal:        General: No swelling.     Cervical back: Neck supple.     Comments: No midline tenderness of the thoracic or lumbar spine.  Soft tissue tenderness of the musculature of the back about the scapula.  Skin:    General: Skin is warm and dry.     Capillary Refill: Capillary refill takes less than 2 seconds.  Neurological:     Mental Status: She is alert.     Comments: 5 out of 5 strength all 4 extremities with intact sensation to light touch.  Psychiatric:        Mood and Affect: Mood normal.    ED Results / Procedures / Treatments   Labs (all labs ordered are listed, but only abnormal results are displayed) Labs Reviewed  COMPREHENSIVE METABOLIC PANEL - Abnormal; Notable for the following components:      Result Value   CO2 20 (*)    Glucose, Bld 110 (*)    All other components within normal limits  CBC WITH DIFFERENTIAL/PLATELET  LIPASE, BLOOD  TROPONIN I (HIGH SENSITIVITY)  TROPONIN I (HIGH SENSITIVITY)    EKG EKG Interpretation  Date/Time:  Friday September 03 2021 02:38:00 EST Ventricular Rate:  66 PR Interval:  144 QRS Duration: 88 QT Interval:  430 QTC Calculation: 450 R Axis:   75 Text Interpretation: Normal sinus rhythm Cannot rule out Inferior infarct , age undetermined Abnormal ECG Confirmed by Regan Lemming ([id number]) on 09/03/2021 9:47:51 AM  Radiology DG Chest 2 View  Result Date: 09/03/2021 CLINICAL DATA:  Chest and back pain. EXAM: CHEST - 2 VIEW; THORACIC SPINE 2 VIEWS COMPARISON:  None. FINDINGS: The heart size and mediastinal contours are  within normal limits. Both lungs are clear. Vertebral body heights and alignment are within normal limits. Mild multilevel degenerative endplate changes are noted. IMPRESSION: 1. No acute cardiopulmonary process. 2. 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  Electronically Signed   By: Brett Fairy M.D.   On: 09/03/2021 03:57   DG Thoracic Spine 2 View  Result Date: 09/03/2021 CLINICAL DATA:  Chest and back pain. EXAM: CHEST - 2 VIEW; THORACIC SPINE 2 VIEWS COMPARISON:  None. FINDINGS: The heart size and mediastinal contours are within normal limits. Both lungs are clear. Vertebral body heights and alignment are within normal limits. Mild multilevel degenerative endplate changes are noted. IMPRESSION: 1. No acute cardiopulmonary process. 2. 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 Electronically Signed   By: Brett Fairy M.D.   On: 09/03/2021 03:57    Procedures Procedures   Medications Ordered in ED Medications  naproxen (NAPROSYN) tablet 500 mg (has no administration in time range)    ED Course  I have reviewed the triage vital signs and the nursing notes.  Pertinent labs & imaging results that were available during my care of the patient were reviewed by me and considered in my medical decision making (see chart for details).    MDM Rules/Calculators/A&P                          55 year old female presenting to the emergency department with back pain and chest pain.  The patient states that she was working in the kitchen yesterday when she felt a strain in her upper thoracic back on the left.  She endorses musculoskeletal soft tissue pain that is sharp, mild in intensity, nonradiating, worse with movement, better with rest.  She had an associated muscle spasm with the pain and took a muscle relaxant that she got from [place].  Following taking this medication, she had an episode of gastroesophageal reflux during which time she felt burning in her chest.  Symptoms have since  resolved since coming to the emergency department.  She still endorses mild left upper back pain.  No other complaints at this time.  She denies any red flag back pain symptoms.  No recent falls, trauma, numbness, weakness, fever chills, bowel or bladder incontinence.  On arrival, the patient was stable.  Mildly hypertensive BP 141/76, otherwise unremarkable vitals.  Physical exam significant for normal neurologic exam with 5 out of 5 strength in all 4 extremities, intact sensation to light touch.  The patient denies any red flag symptoms associated with her mid upper back pain.  She has no midline tenderness to palpation on exam.  She has no abdominal tenderness.  Lungs are clear to auscultation bilaterally.  Work-up initiated in triage due to the patient's complaint of chest discomfort, described as a burning sensation epigastrically radiating to her substernal area.  Patient had an EKG which revealed normal sinus rhythm, ventricular rate 66, no ST segment or T wave changes.  Chest x-ray and x-ray of the thoracic spine revealed no acute cardiac or pulmonary abnormality, mild degenerative changes in the thoracic spine noted.   Patient presents with what appears to be musculoskeletal strain of the upper back with associated back spasm.  Low suspicion for acute PE, ACS, aortic dissection, no evidence for pneumonia, Boerhaave's or pneumothorax on x-ray.  She did take a muscle relaxant and experienced episodes of gastroesophageal reflux versus pill esophagitis.  Symptoms have since resolved at this time.  Troponins x2 were collected and resulted normal.  No other significant abnormality on laboratory work-up.  We will treat for muscle strain of the back with naproxen, Flexeril.  We will start the patient on Pepcid and have the patient follow-up  with her PCP to discuss further potential symptoms of GERD.  Overall stable for discharge at this time.   Final Clinical Impression(s) / ED Diagnoses Final diagnoses:   Back strain, initial encounter  Gastroesophageal reflux disease, unspecified whether esophagitis present    Rx / DC Orders ED Discharge Orders          Ordered    naproxen (NAPROSYN) 500 MG tablet  2 times daily        09/03/21 1012    famotidine (PEPCID) 20 MG tablet  2 times daily        09/03/21 1012    cyclobenzaprine (FLEXERIL) 10 MG tablet  2 times daily PRN        09/03/21 1012             Regan Lemming, MD 09/03/21 1019

## 2021-09-03 NOTE — ED Provider Notes (Signed)
Emergency Medicine Provider Triage Evaluation Note  Katelyn Fields , a 55 y.o. female  was evaluated in triage.  Pt complains of thoracic back pain onset this afternoon. Pt reports symptoms initially felt like muscle spasms in her back.  She used some back and body medication without significant relief.  Patient then developed nausea and chest pain.  She reports she has mild shortness of breath.  Denies leg swelling.  No specific aggravating or alleviating factors.  Patient has a history of high cholesterol and hypertension.  Is not currently medicated as this has been well controlled for the last year with diet and exercise.  Review of Systems  Positive: Chest pain, back pain, nausea Negative: Vomiting, syncope  Physical Exam  BP (!) 147/75 (BP Location: Right Arm)    Pulse 68    Temp 97.7 F (36.5 C) (Oral)    Resp 17    Ht 5\' 2"  (1.575 m)    Wt 91.2 kg    SpO2 98%    BMI 36.77 kg/m   Gen:   Awake, no distress   Resp:  Normal effort, clear and equal breath sounds MSK:   Moves extremities without difficulty, no peripheral edema Other:  Speaks in full sentences  Medical Decision Making  Medically screening exam initiated at 3:51 AM.  Appropriate orders placed.  Katelyn Fields was informed that the remainder of the evaluation will be completed by another provider, this initial triage assessment does not replace that evaluation, and the importance of remaining in the ED until their evaluation is complete.  Chest pain and back pain.  Work-up initiated.   Katelyn Fields, Katelyn Fields 09/03/21 0947    Katelyn Speak, MD 09/03/21 [phone]

## 2021-09-03 NOTE — Discharge Instructions (Addendum)
You were evaluated in the Emergency Department and after careful evaluation, we did not find any emergent condition requiring admission or further testing in the hospital.  Your exam/testing today was overall reassuring.  Your x-ray showed mild degenerative changes in your thoracic spine.  Your EKG was reassuring and you had negative cardiac enzymes x2 and your chest x-ray was clear.  You likely have a muscle strain in your upper back causing some pain and discomfort.  Recommend rest, intermittent ice and heating of the affected area, NSAIDs for pain control in addition to a muscle relaxer for spasms.  Your chest discomfort is consistent with likely gastroesophageal reflux for which we will start you on Pepcid.  Please return to the Emergency Department if you experience any worsening of your condition.  Thank you for allowing [place] to be a part of your care.

## 2021-09-07 ENCOUNTER — Telehealth: Payer: Self-pay

## 2021-09-07 DIAGNOSIS — Z1239 Encounter for other screening for malignant neoplasm of breast: Secondary | ICD-10-CM

## 2021-09-07 NOTE — Telephone Encounter (Signed)
Called and discussed results. She is going to schedule annual exam. Mammogram ordered as due. Dorris Singh, MD  Family Medicine Teaching Service

## 2021-09-07 NOTE — Telephone Encounter (Signed)
Patient calls nurse line requesting returned phone call from Dr. Owens Shark regarding ED visit on 09/03/2021.   Patient states that she would like to discuss results from this visit with PCP.   Please return call to patient at [phone].  Talbot Grumbling, RN

## 2022-04-25 IMAGING — US US PELVIS COMPLETE WITH TRANSVAGINAL
1 series · 15 of 25 positions shown · non-contrast
Comparison: 11/05/2015

CLINICAL DATA: Pelvic pain, enlarged uterus, LMP 06/20/2020



[Series 1: us pelvis complete with transvaginal · 15 of 131 slices shown]
[im 1/131]
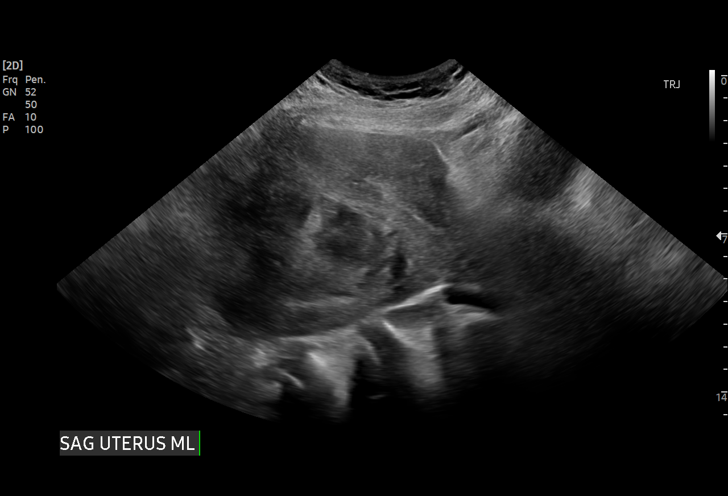
[im 11/131]
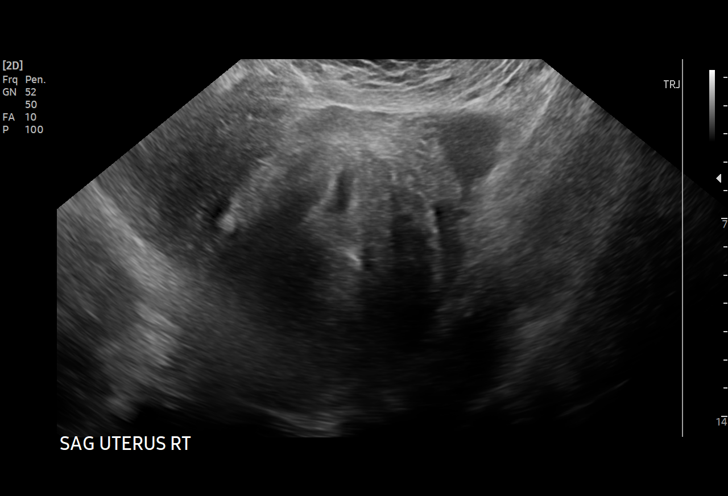
[im 22/131]
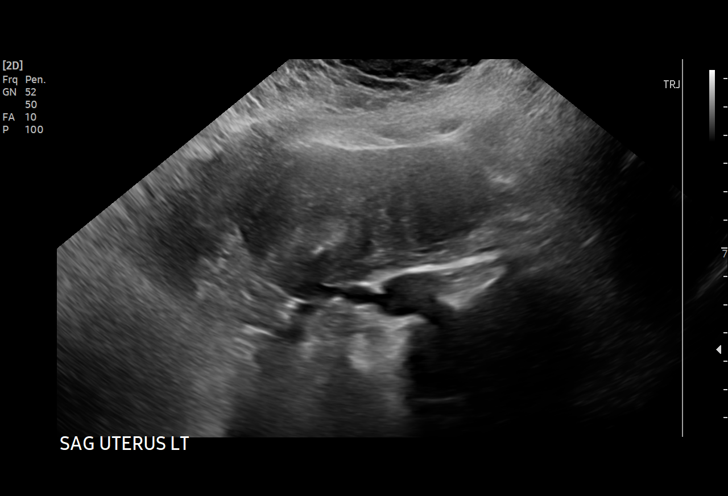
[im 28/131]
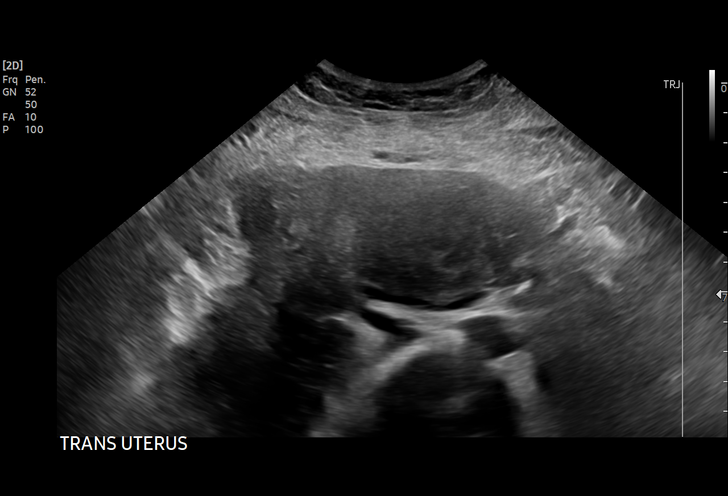
[im 38/131]
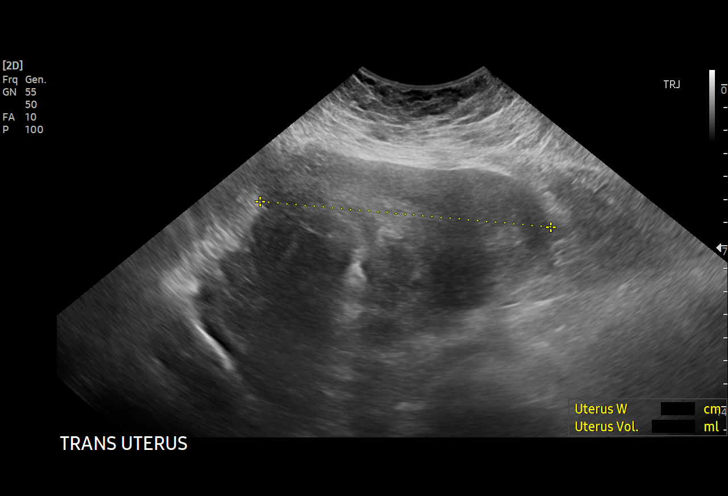
[im 49/131]
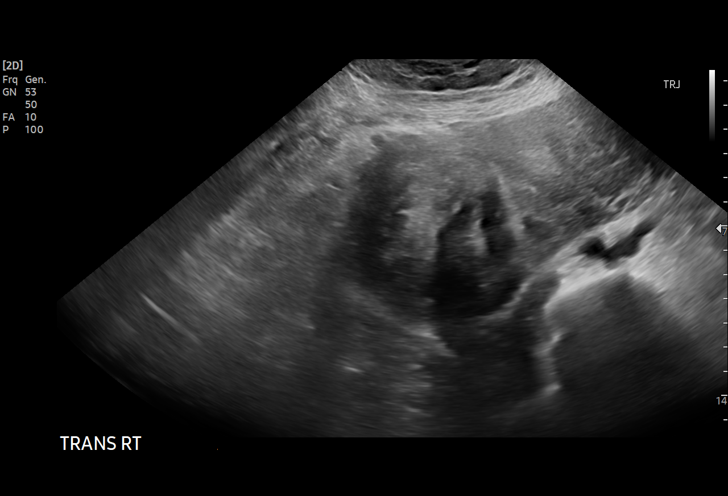
[im 55/131]
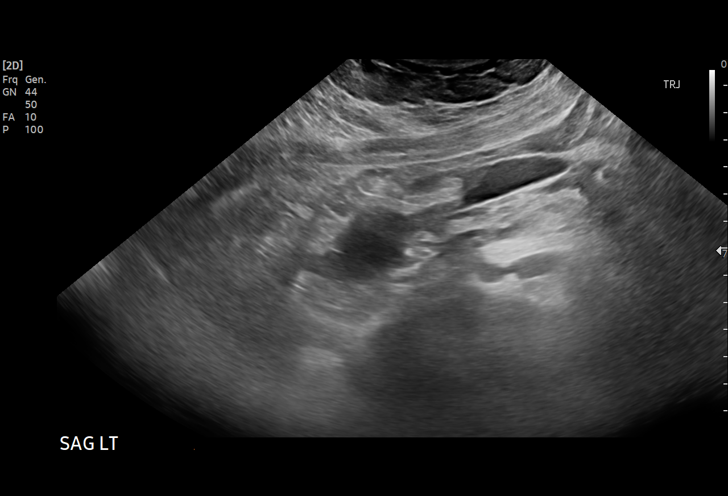
[im 66/131]
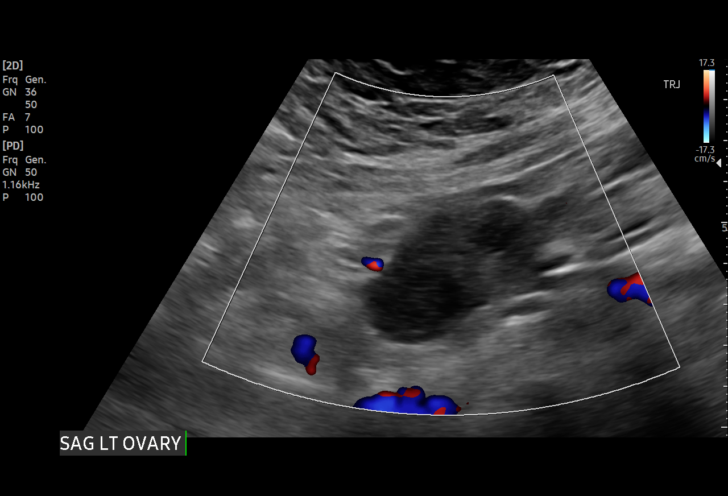
[im 76/131]
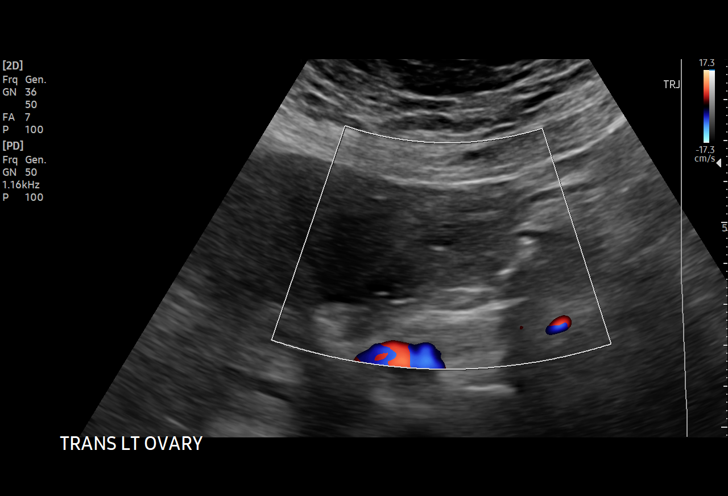
[im 82/131]
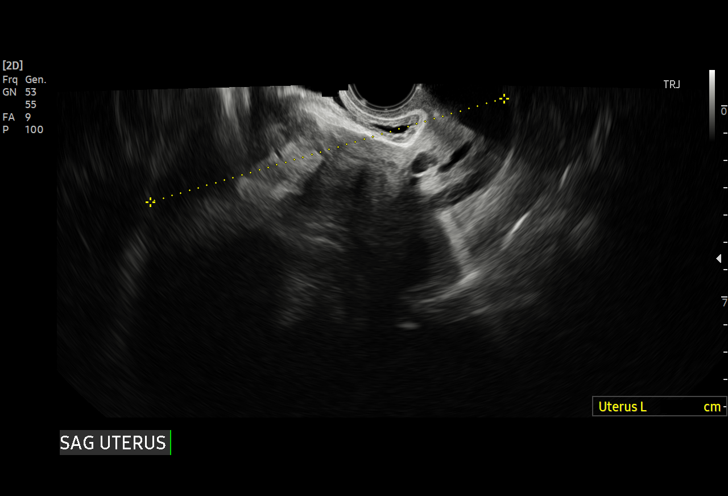
[im 93/131]
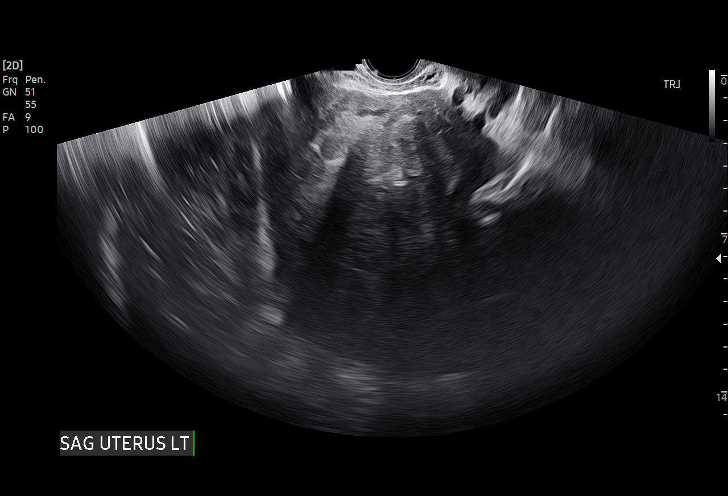
[im 103/131]
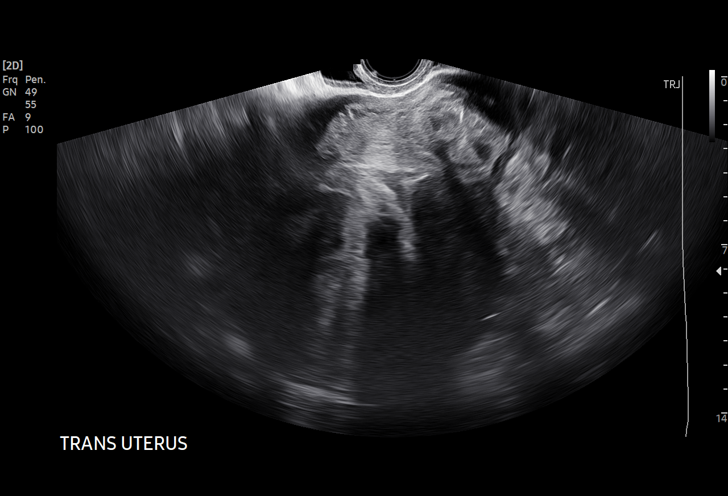
[im 109/131]
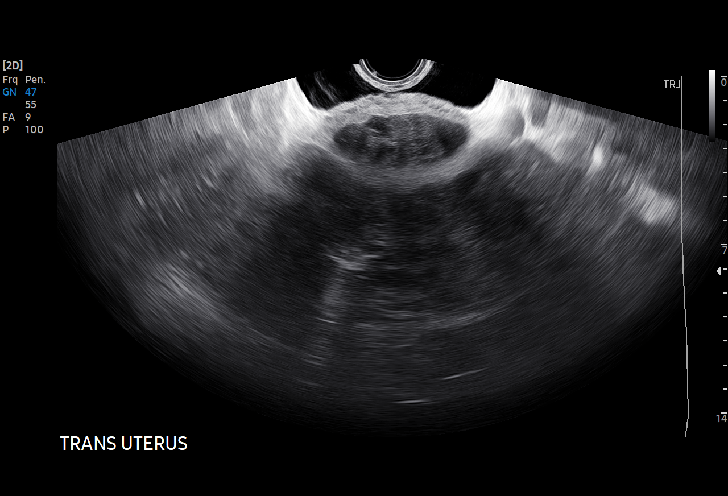
[im 120/131]
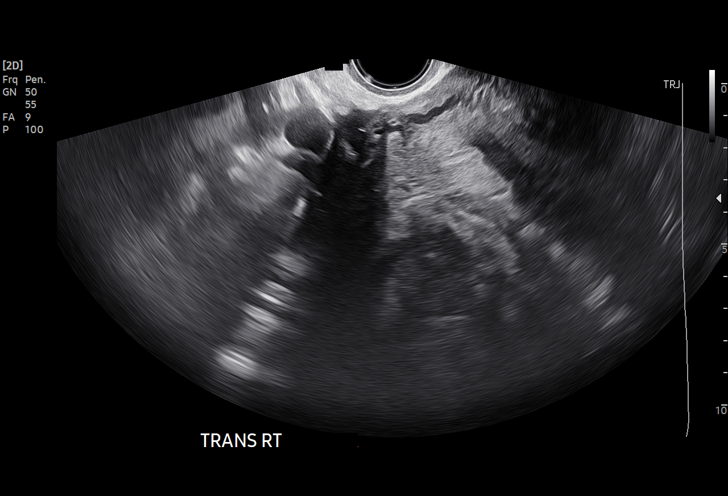
[im 131/131]
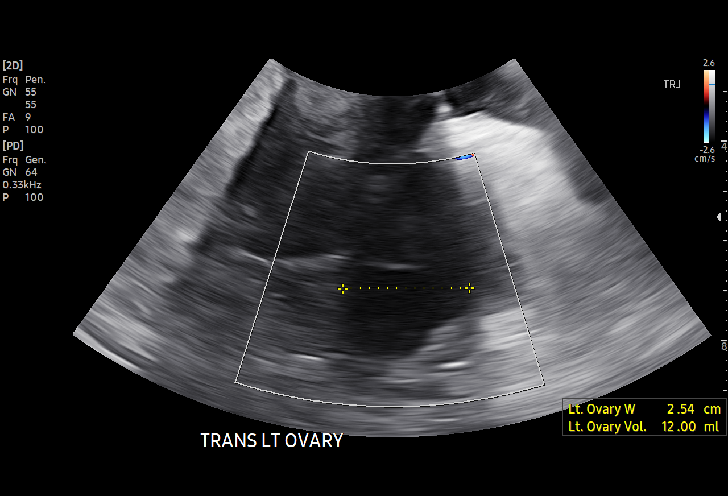

[15 of 25 positions shown; findings below may reference images not displayed]

FINDINGS: Uterus

Measurements: 15.1 x 9.4 x 12.7 cm = volume: 944 mL. Heterogeneous
myometrium. Mass identified at posterior upper uterus consistent
with subserosal leiomyoma 9.6 x 9.1 x 7.8 cm. Additional leiomyoma
anteriorly to LEFT at mid upper uterus, subserosal as well, 4.2 x
4.0 x 3.3 cm

Endometrium

Thickness: 9 mm. No endometrial fluid or focal abnormality within
visualized portions. Portion of the upper uterine segment
endometrial complex is obscured by leiomyomata.

Right ovary

Not visualized, likely obscured by bowel

Left ovary

Measurements: 4.6 x 2.6 x 3.0 cm = volume: 18.5 mL. Normal
morphology without mass

Other findings

No free pelvic fluid.  No adnexal masses.
IMPRESSION: Nonvisualization of RIGHT ovary.

Two subserosal uterine leiomyomata, larger 9.6 cm posterior RIGHT
and smaller 4.2 cm anterior upper LEFT.

Remainder of exam unremarkable.

## 2022-05-31 ENCOUNTER — Other Ambulatory Visit: Payer: Self-pay

## 2022-05-31 ENCOUNTER — Encounter: Payer: Self-pay | Admitting: Family Medicine

## 2022-05-31 ENCOUNTER — Ambulatory Visit (INDEPENDENT_AMBULATORY_CARE_PROVIDER_SITE_OTHER): Payer: 59 | Admitting: Family Medicine

## 2022-05-31 VITALS — BP 137/65 | HR 72 | Wt 209.4 lb

## 2022-05-31 DIAGNOSIS — Z1231 Encounter for screening mammogram for malignant neoplasm of breast: Secondary | ICD-10-CM

## 2022-05-31 DIAGNOSIS — Z0000 Encounter for general adult medical examination without abnormal findings: Secondary | ICD-10-CM

## 2022-05-31 DIAGNOSIS — H538 Other visual disturbances: Secondary | ICD-10-CM | POA: Diagnosis not present

## 2022-05-31 DIAGNOSIS — R350 Frequency of micturition: Secondary | ICD-10-CM

## 2022-05-31 DIAGNOSIS — R600 Localized edema: Secondary | ICD-10-CM | POA: Insufficient documentation

## 2022-05-31 DIAGNOSIS — Z23 Encounter for immunization: Secondary | ICD-10-CM | POA: Diagnosis not present

## 2022-05-31 LAB — POCT URINALYSIS DIP (MANUAL ENTRY)
Bilirubin, UA: NEGATIVE
Glucose, UA: NEGATIVE mg/dL
Ketones, POC UA: NEGATIVE mg/dL
Leukocytes, UA: NEGATIVE
Nitrite, UA: NEGATIVE
Protein Ur, POC: NEGATIVE mg/dL
Spec Grav, UA: 1.015 (ref 1.010–1.025)
Urobilinogen, UA: 0.2 E.U./dL
pH, UA: 6 (ref 5.0–8.0)

## 2022-05-31 LAB — POCT UA - MICROSCOPIC ONLY: WBC, Ur, HPF, POC: NONE SEEN (ref 0–5)

## 2022-05-31 NOTE — Progress Notes (Signed)
    SUBJECTIVE:   Chief compliant/HPI: annual examination  Katelyn Fields is a 56 y.o. who presents today for an annual exam.   Patient has several concerns today.  Her biggest concern is right-sided submandibular swelling. No fevers, redness, difficulty swallowing. She is a never smoker. This has been ongoing for several months.   She additional reports symptoms of menopause. Reports periods are now every 2 months. Sister entered menopause at 38, mom at 57. She endorses some urinary frequency, hot flashes. Denies heavy menses. Does endorse some weight change, sleep changes. She reports overall poor sleep (wakes frequently).  History tabs reviewed and updated yes-- family history of HTN and DM.   Review of systems form reviewed and notable for none Denies headaches, chest pain, dyspnea, abdominal pain, melena, hematochezia.   OBJECTIVE:   BP 137/65   Pulse 72   Wt 209 lb 6.4 oz (95 kg)   SpO2 98%   BMI 38.30 kg/m   HEENT: EOMI. Sclera without injection or icterus. MMM. External auditory canal examined and WNL. TM normal appearance, no erythema or bulging. Neck: Supple.+enlarged R submandibular gland  Cardiac: Regular rate and rhythm. Normal S1/S2. No murmurs, rubs, or gallops appreciated. Lungs: Clear bilaterally to ascultation.  Abdomen: Normoactive bowel sounds. No tenderness to deep or light palpation. No rebound or guarding.    Neuro: Normal speech Ext: no edema   Psych: Pleasant and appropriate    ASSESSMENT/PLAN:   Urinary frequency No hematuria, intermittent burning for months Will send urine for dipstick and culture if appropriate   Salivary gland swelling R sided submandibular gland Has been several months she noticed this  Differential includes stone, cyst, tumor Given age, will proceed with CT   Blurry vision, left eye Chronic for more than 1 year Has had optometry evaluation Family history of glaucoma---will  Refer to Ophthalmology    Menopausal  symptoms Given fatigue will check CBC, TSH, Vitamin D   Annual Examination  See AVS for age appropriate recommendations  PHQ score 0, reviewed and discussed.  BP reviewed and at goal .  Considered the following items based upon USPSTF recommendations: Diabetes screening: ordered Screening for elevated cholesterol: ordered    Discussed family history, BRCA testing not indicated. Tool used to risk stratify was Pedigree Assessment.  Cervical cancer screening: prior Pap reviewed, repeat due in 2026 Breast cancer screening: discussed potential benefits, risks including overdiagnosis and biopsy, elected proceed with mammogram Colorectal cancer screening: discussed briefly, patient would like to wait until after CT, follow up 1 month  Lung cancer screening:  NA . See documentation below regarding indications/risks/benefits.  Vaccinations Flu given .   Follow up in 1 month to discuss [place], MD [place]

## 2022-05-31 NOTE — Assessment & Plan Note (Signed)
Chronic for more than 1 year Has had optometry evaluation Family history of glaucoma---will  Refer to Ophthalmology

## 2022-05-31 NOTE — Assessment & Plan Note (Signed)
No hematuria, intermittent burning for months Will send urine for dipstick and culture if appropriate

## 2022-05-31 NOTE — Patient Instructions (Addendum)
It was wonderful to see you today.  Please bring ALL of your medications with you to every visit.   Today we talked about:  --Coming back in 1 month to check your blood pressure Please measure your blood pressure at home or at the pharmacy over the next 1-2 months   - Getting lab work  - You will be called about your CAT scan for your neck   I recommend you undergo a mammogram.   You can call to schedule an appointment by calling [phone].   Directions [place], [place] [id number]  Please let me know if you have questions. I will send you a letter or call you with results.    At your next visit we will discuss a colonoscopy    Please follow up in 1 months   Thank you for choosing [facility].   Please call [phone] with any questions about today's appointment.  Please be sure to schedule follow up at the front  desk before you leave today.   Dorris Singh, MD  Family Medicine

## 2022-05-31 NOTE — Assessment & Plan Note (Signed)
R sided submandibular gland Has been several months she noticed this  Differential includes stone, cyst, tumor Given age, will proceed with CT

## 2022-06-01 LAB — CBC
Hematocrit: 40.3 % (ref 34.0–46.6)
Hemoglobin: 13.6 g/dL (ref 11.1–15.9)
MCH: 27 pg (ref 26.6–33.0)
MCHC: 33.7 g/dL (ref 31.5–35.7)
MCV: 80 fL (ref 79–97)
Platelets: 314 10*3/uL (ref 150–450)
RBC: 5.04 x10E6/uL (ref 3.77–5.28)
RDW: 13.3 % (ref 11.7–15.4)
WBC: 5.4 10*3/uL (ref 3.4–10.8)

## 2022-06-01 LAB — TSH: TSH: 2.64 u[IU]/mL (ref 0.450–4.500)

## 2022-06-01 LAB — COMPREHENSIVE METABOLIC PANEL
ALT: 37 IU/L — ABNORMAL HIGH (ref 0–32)
AST: 26 IU/L (ref 0–40)
Albumin/Globulin Ratio: 1.8 (ref 1.2–2.2)
Albumin: 4.8 g/dL (ref 3.8–4.9)
Alkaline Phosphatase: 142 IU/L — ABNORMAL HIGH (ref 44–121)
BUN/Creatinine Ratio: 16 (ref 9–23)
BUN: 10 mg/dL (ref 6–24)
Bilirubin Total: 0.3 mg/dL (ref 0.0–1.2)
CO2: 22 mmol/L (ref 20–29)
Calcium: 9.3 mg/dL (ref 8.7–10.2)
Chloride: 102 mmol/L (ref 96–106)
Creatinine, Ser: 0.63 mg/dL (ref 0.57–1.00)
Globulin, Total: 2.7 g/dL (ref 1.5–4.5)
Glucose: 102 mg/dL — ABNORMAL HIGH (ref 70–99)
Potassium: 4.5 mmol/L (ref 3.5–5.2)
Sodium: 139 mmol/L (ref 134–144)
Total Protein: 7.5 g/dL (ref 6.0–8.5)
eGFR: 104 mL/min/{1.73_m2} (ref >59)

## 2022-06-01 LAB — HEMOGLOBIN A1C
Est. average glucose Bld gHb Est-mCnc: 117 mg/dL
Hgb A1c MFr Bld: 5.7 % — ABNORMAL HIGH (ref 4.8–5.6)

## 2022-06-01 LAB — LIPID PANEL
Chol/HDL Ratio: 5.5 ratio — ABNORMAL HIGH (ref 0.0–4.4)
Cholesterol, Total: 263 mg/dL — ABNORMAL HIGH (ref 100–199)
HDL: 48 mg/dL (ref >39)
LDL Chol Calc (NIH): 180 mg/dL — ABNORMAL HIGH (ref 0–99)
Triglycerides: 188 mg/dL — ABNORMAL HIGH (ref 0–149)
VLDL Cholesterol Cal: 35 mg/dL (ref 5–40)

## 2022-06-01 LAB — VITAMIN D 25 HYDROXY (VIT D DEFICIENCY, FRACTURES): Vit D, 25-Hydroxy: 17.9 ng/mL — ABNORMAL LOW (ref 30.0–100.0)

## 2022-06-02 ENCOUNTER — Telehealth: Payer: Self-pay | Admitting: Family Medicine

## 2022-06-02 DIAGNOSIS — E559 Vitamin D deficiency, unspecified: Secondary | ICD-10-CM

## 2022-06-02 MED ORDER — CHOLECALCIFEROL 25 MCG (1000 UT) PO TABS: 1000.0000 [IU] | ORAL_TABLET | Freq: Every day | ORAL | 3 refills | Status: AC

## 2022-06-02 MED ORDER — CHOLECALCIFEROL 25 MCG (1000 UT) PO TABS: 1000.0000 [IU] | ORAL_TABLET | Freq: Every day | ORAL | 3 refills | Status: DC

## 2022-06-02 NOTE — Telephone Encounter (Signed)
Called and discussed labs. Recommended statin, would prefer dietary changes. Repeat LDL in 4-6 weeks. Restart vitamin D. A1C slightly elevated

## 2022-06-08 ENCOUNTER — Encounter: Payer: Self-pay | Admitting: Family Medicine

## 2022-06-17 ENCOUNTER — Ambulatory Visit: Payer: 59 | Admitting: Family Medicine

## 2022-06-24 ENCOUNTER — Ambulatory Visit
Admission: RE | Admit: 2022-06-24 | Discharge: 2022-06-24 | Disposition: A | Discharge status: Home / Self Care | Payer: 59 | Source: Ambulatory Visit | Attending: Family Medicine | Admitting: Family Medicine

## 2022-06-24 DIAGNOSIS — Z1231 Encounter for screening mammogram for malignant neoplasm of breast: Secondary | ICD-10-CM

## 2022-06-24 DIAGNOSIS — R600 Localized edema: Secondary | ICD-10-CM

## 2022-06-24 MED ORDER — IOPAMIDOL (ISOVUE-300) INJECTION 61%
75.0000 mL | Freq: Once | INTRAVENOUS | Status: AC | PRN
  Administered 2022-06-24: 75 mL via INTRAVENOUS

## 2022-06-27 ENCOUNTER — Encounter: Payer: Self-pay | Admitting: Family Medicine

## 2022-06-28 ENCOUNTER — Telehealth: Payer: Self-pay

## 2022-06-28 NOTE — Telephone Encounter (Signed)
Patient calls nurse line returning call to Dr. Owens Shark. Advised of results per result note.   Patient appreciative and will follow up with PCP on 07/08/22.  Talbot Grumbling, RN

## 2022-07-08 ENCOUNTER — Encounter: Payer: Self-pay | Admitting: Family Medicine

## 2022-07-08 ENCOUNTER — Other Ambulatory Visit: Payer: Self-pay

## 2022-07-08 ENCOUNTER — Ambulatory Visit (INDEPENDENT_AMBULATORY_CARE_PROVIDER_SITE_OTHER): Payer: 59 | Admitting: Family Medicine

## 2022-07-08 VITALS — BP 138/74 | HR 61 | Wt 215.6 lb

## 2022-07-08 DIAGNOSIS — Z1211 Encounter for screening for malignant neoplasm of colon: Secondary | ICD-10-CM | POA: Diagnosis not present

## 2022-07-08 DIAGNOSIS — I10 Essential (primary) hypertension: Secondary | ICD-10-CM

## 2022-07-08 DIAGNOSIS — R109 Unspecified abdominal pain: Secondary | ICD-10-CM | POA: Diagnosis not present

## 2022-07-08 DIAGNOSIS — M25562 Pain in left knee: Secondary | ICD-10-CM

## 2022-07-08 DIAGNOSIS — R7401 Elevation of levels of liver transaminase levels: Secondary | ICD-10-CM | POA: Diagnosis not present

## 2022-07-08 DIAGNOSIS — E785 Hyperlipidemia, unspecified: Secondary | ICD-10-CM | POA: Diagnosis not present

## 2022-07-08 DIAGNOSIS — E669 Obesity, unspecified: Secondary | ICD-10-CM

## 2022-07-08 LAB — POCT UA - MICROSCOPIC ONLY
Bacteria, U Microscopic: NONE SEEN
WBC, Ur, HPF, POC: NONE SEEN (ref 0–5)

## 2022-07-08 LAB — POCT URINALYSIS DIP (MANUAL ENTRY)
Bilirubin, UA: NEGATIVE
Glucose, UA: NEGATIVE mg/dL
Ketones, POC UA: NEGATIVE mg/dL
Leukocytes, UA: NEGATIVE
Nitrite, UA: NEGATIVE
Protein Ur, POC: NEGATIVE mg/dL
Spec Grav, UA: <=1.005 — AB (ref 1.010–1.025)
Urobilinogen, UA: 0.2 E.U./dL
pH, UA: 5.5 (ref 5.0–8.0)

## 2022-07-08 MED ORDER — DICLOFENAC SODIUM 1 % EX GEL: 4.0000 g | Freq: Four times a day (QID) | CUTANEOUS | 1 refills | Status: AC

## 2022-07-08 NOTE — Progress Notes (Signed)
SUBJECTIVE:   CHIEF COMPLAINT: follow up HPI:   Katelyn Fields is a 56 y.o.  with history notable for HTN (diet controlled) and dyslipidemia presenting for follow up.  She reports intermittent back pain and knee pain.    Patient reports she has thoracic and low back pain for several years.  This is more located in the right side as compared to the left.  It worsens with sitting at work.  No weakness, numbness, saddle anesthesia, bowel or bladder habit changes.  She has not tried thing for this.  She has had weight gain and strongly desires to lose weight.  She is interested in meeting with a dietitian for this.   The patient reports right leg numbness at times.  This is not associate with her back pain.  This is anterior located along the anterior medial aspect of her thigh.  She is notices worsening with weight gain.  She has tried nothing for this.  Patient reports intermittent left knee pain.  This is chronic for many years.  She fell on her knee a few years ago and notices worsening since then.  No locking clicking or catching.  She has tried nothing for this.  Patient endorses continued urinary frequency. Today has mild dysuria. Uses bathroom 2X at night. Has improved since reducing fluid after dinner.   PERTINENT  PMH / PSH/Family/Social History : updated and reviewed  OBJECTIVE:   BP 138/74   Pulse 61   Wt 215 lb 9.6 oz (97.8 kg)   SpO2 100%   BMI 39.43 kg/m   Today's weight:  Last Weight  Most recent update: 07/08/2022  8:28 AM    Weight  97.8 kg (215 lb 9.6 oz)            Review of prior weights: [employer]   07/08/22 0827  Weight: 215 lb 9.6 oz (97.8 kg)     Cardiac: Regular rate and rhythm. Normal S1/S2. No murmurs, rubs, or gallops appreciated. Lungs: Clear bilaterally to ascultation.   MSK exam gait is normal and stable.  Back is examined.  Tenderness only along the right paraspinal muscles along the lumbar and thoracic area.  No spinous process or  SI tenderness. Bilateral knees are examined.  There is no erythema or effusion.  Mild tenderness to palpation on the medial joint line on the left knee.  There is no crepitus with range of motion she has good range of motion bilaterally.   ASSESSMENT/PLAN:   Dyslipidemia Recently started dietary changes Repeat today to see progress May need statin     Elevated ALT/AST No alcohol use.  Suspect nonalcoholic fatty liver disease.  Will obtain routine hepatitis panel and repeat today.  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  We discussed using a different waist band on her pants and benefits of weight loss.  Referral to Dr. Jenne Campus.  Other differentials considered but this does not appear to be related to her chronic back pain.  Chronic low back pain Suspect myofascial pain syndrome, no suspicion for osteoporotic fracture or spinal stenosis given history.  Discussed benefits of continued activity, supportive seating at work and weight loss.  Consider imaging at follow-up.  Left knee pain Suspect osteoarthritis versus patellofemoral syndrome.  Location and crepitus making more suspicious for OA.  Start Voltaren gel.  X-ray today.  Urinary frequency A1C normal Sent urine--consider culture Question if component of painful bladder syndrome   HCM Colonoscopy ordered  Dorris Singh, [place]

## 2022-07-08 NOTE — Assessment & Plan Note (Signed)
Recently started dietary changes Repeat today to see progress May need statin

## 2022-07-08 NOTE — Patient Instructions (Addendum)
It was wonderful to see you today.  Please bring ALL of your medications with you to every visit.   Today we talked about: - For your knee - Voltaren gel up to four times per day  An x-ray was ordered for you---you do not need an appointment to have this completed.  I recommend going to [facility]    If the results are normal,I will send you a letter  I will call you with results if anything is abnormal    I recommend you have a colonoscopy--they will call you  For your right leg, I recommend looser pants  For your back--I recommend back support  Weight management  For weight management: Call Dr. Jenne Campus (our nutritionist) to set up an appointment. Her phone number is: [phone].   --     Please follow up in 4 months--March   Thank you for choosing [facility].   Please call [phone] with any questions about today's appointment.  Please be sure to schedule follow up at the front  desk before you leave today.   Dorris Singh, MD  Family Medicine

## 2022-07-09 LAB — HEPATITIS B SURFACE ANTIGEN: Hepatitis B Surface Ag: NEGATIVE

## 2022-07-09 LAB — HEPATIC FUNCTION PANEL
ALT: 42 IU/L — ABNORMAL HIGH (ref 0–32)
AST: 30 IU/L (ref 0–40)
Albumin: 4.4 g/dL (ref 3.8–4.9)
Alkaline Phosphatase: 130 IU/L — ABNORMAL HIGH (ref 44–121)
Bilirubin Total: 0.2 mg/dL (ref 0.0–1.2)
Bilirubin, Direct: <0.1 mg/dL (ref 0.00–0.40)
Total Protein: 7.2 g/dL (ref 6.0–8.5)

## 2022-07-09 LAB — HCV INTERPRETATION

## 2022-07-09 LAB — HEPATITIS A ANTIBODY, TOTAL: hep A Total Ab: POSITIVE — AB

## 2022-07-09 LAB — HCV AB W REFLEX TO QUANT PCR: HCV Ab: NONREACTIVE

## 2022-07-09 LAB — LDL CHOLESTEROL, DIRECT: LDL Direct: 137 mg/dL — ABNORMAL HIGH (ref 0–99)

## 2022-07-10 ENCOUNTER — Ambulatory Visit (INDEPENDENT_AMBULATORY_CARE_PROVIDER_SITE_OTHER): Payer: 59

## 2022-07-10 ENCOUNTER — Encounter (HOSPITAL_COMMUNITY): Payer: Self-pay

## 2022-07-10 ENCOUNTER — Ambulatory Visit (HOSPITAL_COMMUNITY)
Admission: RE | Admit: 2022-07-10 | Discharge: 2022-07-10 | Disposition: A | Discharge status: Home / Self Care | Payer: 59 | Source: Ambulatory Visit | Attending: Physician Assistant | Admitting: Physician Assistant

## 2022-07-10 VITALS — BP 138/62 | HR 59 | Temp 97.8°F | Resp 18

## 2022-07-10 DIAGNOSIS — S93492A Sprain of other ligament of left ankle, initial encounter: Secondary | ICD-10-CM | POA: Diagnosis not present

## 2022-07-10 DIAGNOSIS — S86912A Strain of unspecified muscle(s) and tendon(s) at lower leg level, left leg, initial encounter: Secondary | ICD-10-CM | POA: Diagnosis not present

## 2022-07-10 DIAGNOSIS — M25572 Pain in left ankle and joints of left foot: Secondary | ICD-10-CM | POA: Diagnosis not present

## 2022-07-10 DIAGNOSIS — S9002XA Contusion of left ankle, initial encounter: Secondary | ICD-10-CM | POA: Diagnosis not present

## 2022-07-10 MED ORDER — IBUPROFEN 600 MG PO TABS: 600.0000 mg | ORAL_TABLET | Freq: Four times a day (QID) | ORAL | 0 refills | Status: DC | PRN

## 2022-07-10 NOTE — Discharge Instructions (Signed)
Advised to use ice therapy, 10 minutes on 20 minutes off, 3-4 times a day to help reduce swelling. Advised to use the ankle support when up and standing to help give additional strength and support to the ankle. Advised take ibuprofen 600 mg every 8 hours with food to help relieve swelling and discomfort. Advise follow-up PCP or return to urgent care if symptoms fail to improve.

## 2022-07-10 NOTE — ED Provider Notes (Signed)
[place] [facility]    CSN: [id number] Arrival date & time: 07/10/22  1125      History   Chief Complaint Chief Complaint  Patient presents with   Leg Injury    I fallen down and a swelling and pain in my knee and my left ankle. - Entered by patient   Ankle Pain    HPI Katelyn Fields is a 56 y.o. female.   56 year old female presents with left knee and ankle pain.  Patient decays Friday she was walking down the steps out of the house when she tripped and fell.  She relates she twisted her left knee and her left ankle.  Patient indicates since that she has having left knee pain and left ankle pain with swelling and discomfort.  Patient indicates that the ankle hurts worse when she stands and tries to walk.  She indicates she has been taken some Advil that has given her mild relief from the discomfort.  She indicates that the left knee has improved since the injury.  She continues to have left ankle swelling and pain when she tries to bear weight and walk.   Ankle Pain   Past Medical History:  Diagnosis Date   HLD (hyperlipidemia)    Hypertension     Patient Active Problem List   Diagnosis Date Noted   Blurry vision, left eye 05/31/2022   Urinary frequency 05/31/2022   Essential hypertension 11/18/2019   Dyslipidemia 11/18/2019   Healthcare maintenance 11/18/2019    Past Surgical History:  Procedure Laterality Date   ABDOMINAL SURGERY     OVARIAN CYST SURGERY      OB History     Gravida  4   Para      Term      Preterm      AB  4   Living         SAB  4   IAB      Ectopic      Multiple      Live Births               Home Medications    Prior to Admission medications   Medication Sig Start Date End Date Taking? Authorizing Provider  ibuprofen (ADVIL) 600 MG tablet Take 1 tablet (600 mg total) by mouth every 6 (six) hours as needed. 07/10/22  Yes Nyoka Lint, PA-C  Cholecalciferol 25 MCG (1000 UT) tablet Take 1 tablet (1,000 Units  total) by mouth daily. 06/02/22   Martyn Malay, MD  diclofenac Sodium (VOLTAREN) 1 % GEL Apply 4 g topically 4 (four) times daily. 07/08/22   Martyn Malay, MD  Melatonin 3 MG CAPS Take up to 3 capsules nightly Patient not taking: Reported on 05/31/2022 08/04/20   Martyn Malay, MD  Multiple Vitamin (MULTIVITAMIN ADULT PO) Take by mouth. Patient not taking: Reported on 05/31/2022    [provider]  naproxen (NAPROSYN) 500 MG tablet Take 1 tablet (500 mg total) by mouth 2 (two) times daily. Patient not taking: Reported on 05/31/2022 09/03/21   Regan Lemming, MD    Family History Family History  Problem Relation Age of Onset   Hypertension Mother    Hypertension Father    Hypertension Sister    Asthma Sister    Diabetes Brother     Social History Social History   Tobacco Use   Smoking status: Never   Smokeless tobacco: Never  Substance Use Topics   Alcohol use: No  Drug use: No     Allergies   Patient has no known allergies.   Review of Systems Review of Systems  Musculoskeletal:  Positive for joint swelling (left ankle swelling).     Physical Exam Triage Vital Signs ED Triage Vitals  Enc Vitals Group     BP 07/10/22 1254 138/62     Pulse Rate 07/10/22 1254 (!) 59     Resp 07/10/22 1254 18     Temp 07/10/22 1254 97.8 F (36.6 C)     Temp Source 07/10/22 1254 Oral     SpO2 07/10/22 1254 96 %     Weight --      Height --      Head Circumference --      Peak Flow --      Pain Score 07/10/22 1255 8     Pain Loc --      Pain Edu? --      Excl. in [place]? --    No data found.  Updated Vital Signs BP 138/62 (BP Location: Right Arm)   Pulse (!) 59   Temp 97.8 F (36.6 C) (Oral)   Resp 18   SpO2 98%   Visual Acuity Right Eye Distance:   Left Eye Distance:   Bilateral Distance:    Right Eye Near:   Left Eye Near:    Bilateral Near:     Physical Exam Constitutional:      Appearance: Normal appearance.  Musculoskeletal:     Comments:  Left knee: Pain on palpation of the knee that is general, range of motion is normal, stability is intact, no swelling or redness noted.  Left ankle: There is 2+ swelling of the lateral malleolus with pain on palpation.  Range of motion is limited with pain on flexion, extension, and varus valgus motion.  Stability is intact and normal.  Neurological:     Mental Status: She is alert.      UC Treatments / Results  Labs (all labs ordered are listed, but only abnormal results are displayed) Labs Reviewed - No data to display  EKG   Radiology DG Ankle Complete Left  Result Date: 07/10/2022 CLINICAL DATA:  Trauma, pain EXAM: LEFT ANKLE COMPLETE - 3+ VIEW COMPARISON:  None Available. FINDINGS: No fracture or dislocation is seen. There is soft tissue swelling over the lateral malleolus. Small plantar spur is seen in calcaneus. IMPRESSION: No recent fracture or dislocation is seen in left ankle. Electronically Signed   By: Elmer Picker M.D.   On: 07/10/2022 13:35    Procedures Procedures (including critical care time)  Medications Ordered in UC Medications - No data to display  Initial Impression / Assessment and Plan / UC Course  I have reviewed the triage vital signs and the nursing notes.  Pertinent labs & imaging results that were available during my care of the patient were reviewed by me and considered in my medical decision making (see chart for details).    Plan: 1.  The ankle sprain will be treated with the following: A.  Ibuprofen 600 mg every 8 hours with food to reduce pain and swelling. B.  Ankle support to provide stability and relieve discomfort. C.  Ice therapy, 10 minutes on 20 minutes off, 3-4 times a day to help reduce swelling. 2.  The contusion of the left ankle will be treated with the following: A.  Ibuprofen 600 mg every 8 hours with food to help reduce pain and swelling. 3.  Left knee  strain will be treated with the following: A.  Ibuprofen 600 mg  every 8 hours with food to help reduce pain and discomfort. 4.  Advised to follow-up PCP or return to urgent care if symptoms fail to improve. Final Clinical Impressions(s) / UC Diagnoses   Final diagnoses:  Sprain of anterior talofibular ligament of left ankle, initial encounter  Contusion of left ankle, initial encounter  Knee strain, left, initial encounter     Discharge Instructions      Advised to use ice therapy, 10 minutes on 20 minutes off, 3-4 times a day to help reduce swelling. Advised to use the ankle support when up and standing to help give additional strength and support to the ankle. Advised take ibuprofen 600 mg every 8 hours with food to help relieve swelling and discomfort. Advise follow-up PCP or return to urgent care if symptoms fail to improve.    ED Prescriptions     Medication Sig Dispense Auth. Provider   ibuprofen (ADVIL) 600 MG tablet Take 1 tablet (600 mg total) by mouth every 6 (six) hours as needed. 30 tablet Nyoka Lint, PA-C      PDMP not reviewed this encounter.   Nyoka Lint, PA-C 07/10/22 1344

## 2022-07-10 NOTE — ED Triage Notes (Signed)
Pt reports left knee pain and left ankle pain after falling on Friday. Pt has obvious swelling on the left ankle. Reports pain increases with walking.

## 2022-07-11 ENCOUNTER — Telehealth: Payer: Self-pay

## 2022-07-11 ENCOUNTER — Other Ambulatory Visit (HOSPITAL_COMMUNITY): Payer: Self-pay

## 2022-07-11 NOTE — Telephone Encounter (Signed)
  Received notification from [facility]  that prior authorization is required for Diclofenac Sodium 1% gel.  Ran test claim and NO Prior Auth is needed AT THIS TIME.  It is OTC (over the counter) I spoke to pt and advise to get over the counter.    Sand[facility] Patient Advocate

## 2022-08-12 ENCOUNTER — Encounter (HOSPITAL_COMMUNITY): Payer: Self-pay

## 2022-08-12 ENCOUNTER — Ambulatory Visit (HOSPITAL_COMMUNITY)
Admission: RE | Admit: 2022-08-12 | Discharge: 2022-08-12 | Disposition: A | Discharge status: Home / Self Care | Payer: 59 | Source: Ambulatory Visit | Attending: Emergency Medicine | Admitting: Emergency Medicine

## 2022-08-12 VITALS — HR 70 | Temp 98.0°F | Resp 16

## 2022-08-12 DIAGNOSIS — M79602 Pain in left arm: Secondary | ICD-10-CM

## 2022-08-12 DIAGNOSIS — S4992XA Unspecified injury of left shoulder and upper arm, initial encounter: Secondary | ICD-10-CM | POA: Diagnosis not present

## 2022-08-12 MED ORDER — IBUPROFEN 600 MG PO TABS: 600.0000 mg | ORAL_TABLET | Freq: Four times a day (QID) | ORAL | 0 refills | Status: DC | PRN

## 2022-08-12 NOTE — Discharge Instructions (Signed)
I Recommend using ice on the shoulder for 20 minutes at a time, 3 times a day  You can take the ibuprofen every 6 hours for pain and inflammation  Please follow up with the orthopedic specialists on Monday.

## 2022-08-12 NOTE — ED Triage Notes (Signed)
Pt injured left arm x 2days ago causing pain and discomfort .

## 2022-08-12 NOTE — ED Provider Notes (Signed)
[place]    CSN: [id number] Arrival date & time: 08/12/22  1510      History   Chief Complaint Chief Complaint  Patient presents with   Arm Injury    shoulder pain - Entered by patient    HPI Katelyn Fields is a 56 y.o. female.  Presents with arm injury that occurred 2 days ago Reports lifted heavy luggage and felt shoulder pain Reports pain that sometimes radiates into the left arm Worse with abduction of the arm.   Took ibuprofen and applied ice which improved symptoms  Denies numbness or tingling   Past Medical History:  Diagnosis Date   HLD (hyperlipidemia)    Hypertension     Patient Active Problem List   Diagnosis Date Noted   Blurry vision, left eye 05/31/2022   Urinary frequency 05/31/2022   Essential hypertension 11/18/2019   Dyslipidemia 11/18/2019   Healthcare maintenance 11/18/2019    Past Surgical History:  Procedure Laterality Date   ABDOMINAL SURGERY     OVARIAN CYST SURGERY      OB History     Gravida  4   Para      Term      Preterm      AB  4   Living         SAB  4   IAB      Ectopic      Multiple      Live Births               Home Medications    Prior to Admission medications   Medication Sig Start Date End Date Taking? Authorizing Provider  Cholecalciferol 25 MCG (1000 UT) tablet Take 1 tablet (1,000 Units total) by mouth daily. 06/02/22   Martyn Malay, MD  diclofenac Sodium (VOLTAREN) 1 % GEL Apply 4 g topically 4 (four) times daily. 07/08/22   Martyn Malay, MD  ibuprofen (ADVIL) 600 MG tablet Take 1 tablet (600 mg total) by mouth every 6 (six) hours as needed. 08/12/22   Nohealani Medinger, Wells Guiles, PA-C  Melatonin 3 MG CAPS Take up to 3 capsules nightly Patient not taking: Reported on 05/31/2022 08/04/20   Martyn Malay, MD  Multiple Vitamin (MULTIVITAMIN ADULT PO) Take by mouth. Patient not taking: Reported on 05/31/2022    [provider]    Family History Family History  Problem  Relation Age of Onset   Hypertension Mother    Hypertension Father    Hypertension Sister    Asthma Sister    Diabetes Brother     Social History Social History   Tobacco Use   Smoking status: Never   Smokeless tobacco: Never  Substance Use Topics   Alcohol use: No   Drug use: No     Allergies   Patient has no known allergies.   Review of Systems Review of Systems As per HPI  Physical Exam Triage Vital Signs ED Triage Vitals  Enc Vitals Group     BP --      Pulse Rate 08/12/22 1527 70     Resp 08/12/22 1527 16     Temp 08/12/22 1527 98 F (36.7 C)     Temp Source 08/12/22 1527 Oral     SpO2 08/12/22 1527 98 %     Weight --      Height --      Head Circumference --      Peak Flow --  Pain Score 08/12/22 1524 10     Pain Loc --      Pain Edu? --      Excl. in [place]? --    No data found.  Updated Vital Signs Pulse 70   Temp 98 F (36.7 C) (Oral)   Resp 16   LMP 04/30/2021 (Approximate)   SpO2 98%     Physical Exam Vitals and nursing note reviewed.  Constitutional:      General: She is not in acute distress.    Appearance: Normal appearance.  HENT:     Mouth/Throat:     Pharynx: Oropharynx is clear.  Cardiovascular:     Rate and Rhythm: Normal rate and regular rhythm.     Pulses: Normal pulses.     Heart sounds: Normal heart sounds.  Pulmonary:     Effort: Pulmonary effort is normal.     Breath sounds: Normal breath sounds.  Musculoskeletal:        General: No swelling, tenderness or deformity.     Comments: Full ROM with shoulder flexion and extension. Abduction is limited by pain. No bony tenderness on exam. No obvious deformity  Skin:    General: Skin is warm and dry.  Neurological:     Mental Status: She is alert and oriented to person, place, and time.     Comments: Strength 5/5 upper extrem. Strong radial pulse. Distal sensation intact.      UC Treatments / Results  Labs (all labs ordered are listed, but only abnormal results  are displayed) Labs Reviewed - No data to display  EKG   Radiology No results found.  Procedures Procedures (including critical care time)  Medications Ordered in UC Medications - No data to display  Initial Impression / Assessment and Plan / UC Course  I have reviewed the triage vital signs and the nursing notes.  Pertinent labs & imaging results that were available during my care of the patient were reviewed by me and considered in my medical decision making (see chart for details).  Based on mechanism of injury and no bony tenderness with exam, discussed with patient xray is not likely to show abnormality given etiology is likely tendon/ligament or other muscular. Patient would like to defer xray today. Discussed using ibuprofen every 6 hours, ice 20 min at a time, moving the shoulder gently to avoid frozen shoulder. Provided two ortho clinic contact info, patient will follow up with them on Monday as needed. Return precautions discussed. Patient agrees to plan  Final Clinical Impressions(s) / UC Diagnoses   Final diagnoses:  Injury of left shoulder, initial encounter  Musculoskeletal arm pain, left     Discharge Instructions      I Recommend using ice on the shoulder for 20 minutes at a time, 3 times a day  You can take the ibuprofen every 6 hours for pain and inflammation  Please follow up with the orthopedic specialists on Monday.    ED Prescriptions     Medication Sig Dispense Auth. Provider   ibuprofen (ADVIL) 600 MG tablet Take 1 tablet (600 mg total) by mouth every 6 (six) hours as needed. 30 tablet Blaine Guiffre, Wells Guiles, PA-C      PDMP not reviewed this encounter.   Ardith Lewman, Vernice Jefferson 08/12/22 1736

## 2023-09-11 ENCOUNTER — Ambulatory Visit
Admission: EM | Admit: 2023-09-11 | Discharge: 2023-09-11 | Disposition: A | Discharge status: Home / Self Care | Payer: 59 | Attending: Family Medicine | Admitting: Family Medicine

## 2023-09-11 DIAGNOSIS — B349 Viral infection, unspecified: Secondary | ICD-10-CM

## 2023-09-11 MED ORDER — ONDANSETRON 8 MG PO TBDP: 8.0000 mg | ORAL_TABLET | Freq: Three times a day (TID) | ORAL | 0 refills | Status: AC | PRN

## 2023-09-11 MED ORDER — PROMETHAZINE-DM 6.25-15 MG/5ML PO SYRP: 5.0000 mL | ORAL_SOLUTION | Freq: Three times a day (TID) | ORAL | 0 refills | Status: AC | PRN

## 2023-09-11 MED ORDER — IBUPROFEN 600 MG PO TABS: 600.0000 mg | ORAL_TABLET | Freq: Four times a day (QID) | ORAL | 0 refills | Status: AC | PRN

## 2023-09-11 NOTE — ED Provider Notes (Signed)
[facility]  Note:  This document was prepared using Conservation officer, historic buildings and may include unintentional dictation errors.  MRN: [id number] DOB: March 27, 1966  Subjective:   Katelyn Fields is a 57 y.o. female presenting for 5-day history of persistent malaise, fatigue, dry cough, fever, occasional loose stool, body pains, sinus congestion.  No chest pain, shortness of breath or wheezing.  No history of asthma.  No smoking of any kind.  No bloody stools, recent antibiotic use, hospitalizations or long distance travel.  Has not eaten raw foods, drank unfiltered water.  No history of GI disorders including Crohn's, IBS, ulcerative colitis.   No current facility-administered medications for this encounter.  Current Outpatient Medications:    Cholecalciferol 25 MCG (1000 UT) tablet, Take 1 tablet (1,000 Units total) by mouth daily., Disp: 90 tablet, Rfl: 3   diclofenac Sodium (VOLTAREN) 1 % GEL, Apply 4 g topically 4 (four) times daily., Disp: 350 g, Rfl: 1   ibuprofen (ADVIL) 600 MG tablet, Take 1 tablet (600 mg total) by mouth every 6 (six) hours as needed., Disp: 30 tablet, Rfl: 0   Melatonin 3 MG CAPS, Take up to 3 capsules nightly (Patient not taking: Reported on 05/31/2022), Disp: 90 capsule, Rfl: 3   Multiple Vitamin (MULTIVITAMIN ADULT PO), Take by mouth. (Patient not taking: Reported on 05/31/2022), Disp: , Rfl:    No Known Allergies  Past Medical History:  Diagnosis Date   HLD (hyperlipidemia)    Hypertension      Past Surgical History:  Procedure Laterality Date   ABDOMINAL SURGERY     OVARIAN CYST SURGERY      Family History  Problem Relation Age of Onset   Hypertension Mother    Hypertension Father    Hypertension Sister    Asthma Sister    Diabetes Brother     Social History   Tobacco Use   Smoking status: Never   Smokeless tobacco: Never  Vaping Use   Vaping status: Never Used  Substance Use Topics   Alcohol use: No   Drug  use: No    ROS   Objective:   Vitals: BP (!) 141/83 (BP Location: Right Arm)   Pulse 77   Temp 98.7 F (37.1 C) (Oral)   Resp 16   LMP 04/30/2021 (Approximate)   SpO2 94%   Physical Exam Constitutional:      General: She is not in acute distress.    Appearance: Normal appearance. She is well-developed. She is not ill-appearing, toxic-appearing or diaphoretic.  HENT:     Head: Normocephalic and atraumatic.     Nose: Nose normal.     Mouth/Throat:     Mouth: Mucous membranes are moist.     Pharynx: No pharyngeal swelling, oropharyngeal exudate, posterior oropharyngeal erythema or uvula swelling.     Tonsils: No tonsillar exudate or tonsillar abscesses. 0 on the right. 0 on the left.  Eyes:     General: No scleral icterus.       Right eye: No discharge.        Left eye: No discharge.     Extraocular Movements: Extraocular movements intact.     Conjunctiva/sclera: Conjunctivae normal.  Cardiovascular:     Rate and Rhythm: Normal rate and regular rhythm.     Heart sounds: Normal heart sounds. No murmur heard.    No friction rub. No gallop.  Pulmonary:     Effort: Pulmonary effort is normal. No respiratory distress.  Breath sounds: No stridor. No wheezing, rhonchi or rales.  Chest:     Chest wall: No tenderness.  Abdominal:     General: Bowel sounds are normal. There is no distension.     Palpations: Abdomen is soft. There is no mass.     Tenderness: There is no abdominal tenderness. There is no right CVA tenderness, left CVA tenderness, guarding or rebound.  Skin:    General: Skin is warm and dry.  Neurological:     General: No focal deficit present.     Mental Status: She is alert and oriented to person, place, and time.  Psychiatric:        Mood and Affect: Mood normal.        Behavior: Behavior normal.        Thought Content: Thought content normal.        Judgment: Judgment normal.     Assessment and Plan :   PDMP not reviewed this encounter.  1.  Acute viral syndrome    Suspect viral syndrome. Physical exam findings reassuring and vital signs stable for discharge. Advised supportive care, offered symptomatic relief. Counseled patient on potential for adverse effects with medications prescribed/recommended today, ER and return-to-clinic precautions discussed, patient verbalized understanding.     Wallis Bamberg, [place] 09/11/23 1223

## 2023-09-11 NOTE — ED Triage Notes (Addendum)
Pt c/o dry cough, fever, diarrhea, body aches, head congestion-sx started 12/25-NAD-steady gait

## 2023-09-11 NOTE — Discharge Instructions (Signed)
We will manage this as a viral illness. For sore throat or cough try using a honey-based tea. Use 3 teaspoons of honey with juice squeezed from half lemon. Place shaved pieces of ginger into 1/2-1 cup of water and warm over stove top. Then mix the ingredients and repeat every 4 hours as needed. Please take ibuprofen 600mg  every 6 hours with food alternating with OR taken together with Tylenol 500mg -650mg  every 6 hours for throat pain, fevers, aches and pains. Hydrate very well with at least 2 liters of water. Eat light meals such as soups (chicken and noodles, vegetable, chicken and wild rice).  Do not eat foods that you are allergic to.  Taking an antihistamine like Zyrtec (10mg  daily) can help against postnasal drainage, sinus congestion which can cause sinus pain, sinus headaches, throat pain, painful swallowing, coughing.  You can take this together with the cough syrup.

## 2024-06-25 ENCOUNTER — Emergency Department (HOSPITAL_COMMUNITY)
Admission: EM | Admit: 2024-06-25 | Discharge: 2024-06-25 | Disposition: A | Discharge status: Home / Self Care | Attending: Emergency Medicine | Admitting: Emergency Medicine

## 2024-06-25 ENCOUNTER — Emergency Department (HOSPITAL_COMMUNITY)

## 2024-06-25 ENCOUNTER — Other Ambulatory Visit: Payer: Self-pay

## 2024-06-25 ENCOUNTER — Encounter (HOSPITAL_COMMUNITY): Payer: Self-pay

## 2024-06-25 ENCOUNTER — Ambulatory Visit
Admission: EM | Admit: 2024-06-25 | Discharge: 2024-06-25 | Disposition: A | Discharge status: Other Acute Inpatient Hospital | Attending: Family Medicine | Admitting: Family Medicine

## 2024-06-25 DIAGNOSIS — R202 Paresthesia of skin: Secondary | ICD-10-CM | POA: Diagnosis not present

## 2024-06-25 DIAGNOSIS — R791 Abnormal coagulation profile: Secondary | ICD-10-CM | POA: Insufficient documentation

## 2024-06-25 DIAGNOSIS — R200 Anesthesia of skin: Secondary | ICD-10-CM

## 2024-06-25 DIAGNOSIS — R9431 Abnormal electrocardiogram [ECG] [EKG]: Secondary | ICD-10-CM | POA: Diagnosis not present

## 2024-06-25 DIAGNOSIS — R531 Weakness: Secondary | ICD-10-CM | POA: Diagnosis not present

## 2024-06-25 DIAGNOSIS — R519 Headache, unspecified: Secondary | ICD-10-CM | POA: Diagnosis not present

## 2024-06-25 DIAGNOSIS — R42 Dizziness and giddiness: Secondary | ICD-10-CM

## 2024-06-25 DIAGNOSIS — H8309 Labyrinthitis, unspecified ear: Secondary | ICD-10-CM

## 2024-06-25 DIAGNOSIS — I10 Essential (primary) hypertension: Secondary | ICD-10-CM | POA: Insufficient documentation

## 2024-06-25 HISTORY — DX: Benign neoplasm of meninges, unspecified: D32.9

## 2024-06-25 HISTORY — DX: Labyrinthitis, unspecified ear: H83.09

## 2024-06-25 HISTORY — DX: Migraine with aura, not intractable, without status migrainosus: G43.109

## 2024-06-25 HISTORY — DX: Other peripheral vertigo, right ear: H81.391

## 2024-06-25 LAB — APTT: aPTT: 29 s (ref 24–36)

## 2024-06-25 LAB — DIFFERENTIAL
Abs Immature Granulocytes: 0.02 K/uL (ref 0.00–0.07)
Basophils Absolute: 0.1 K/uL (ref 0.0–0.1)
Basophils Relative: 1 %
Eosinophils Absolute: 0 K/uL (ref 0.0–0.5)
Eosinophils Relative: 0 %
Immature Granulocytes: 0 %
Lymphocytes Relative: 21 %
Lymphs Abs: 1.4 K/uL (ref 0.7–4.0)
Monocytes Absolute: 0.4 K/uL (ref 0.1–1.0)
Monocytes Relative: 5 %
Neutro Abs: 5 K/uL (ref 1.7–7.7)
Neutrophils Relative %: 73 %

## 2024-06-25 LAB — GLUCOSE, POCT (MANUAL RESULT ENTRY): POC Glucose: 104 mg/dL — AB (ref 70–99)

## 2024-06-25 LAB — COMPREHENSIVE METABOLIC PANEL WITH GFR
ALT: 97 U/L — ABNORMAL HIGH (ref 0–44)
AST: 80 U/L — ABNORMAL HIGH (ref 15–41)
Albumin: 4 g/dL (ref 3.5–5.0)
Alkaline Phosphatase: 100 U/L (ref 38–126)
Anion gap: 14 (ref 5–15)
BUN: 8 mg/dL (ref 6–20)
CO2: 20 mmol/L — ABNORMAL LOW (ref 22–32)
Calcium: 9.1 mg/dL (ref 8.9–10.3)
Chloride: 101 mmol/L (ref 98–111)
Creatinine, Ser: 0.71 mg/dL (ref 0.44–1.00)
GFR, Estimated: >60 mL/min (ref >60)
Glucose, Bld: 101 mg/dL — ABNORMAL HIGH (ref 70–99)
Potassium: 4 mmol/L (ref 3.5–5.1)
Sodium: 135 mmol/L (ref 135–145)
Total Bilirubin: 0.5 mg/dL (ref 0.0–1.2)
Total Protein: 7.4 g/dL (ref 6.5–8.1)

## 2024-06-25 LAB — PROTIME-INR
INR: 1 (ref 0.8–1.2)
Prothrombin Time: 13.8 s (ref 11.4–15.2)

## 2024-06-25 LAB — CBC
HCT: 40.9 % (ref 36.0–46.0)
Hemoglobin: 13.2 g/dL (ref 12.0–15.0)
MCH: 26.8 pg (ref 26.0–34.0)
MCHC: 32.3 g/dL (ref 30.0–36.0)
MCV: 83 fL (ref 80.0–100.0)
Platelets: 264 K/uL (ref 150–400)
RBC: 4.93 MIL/uL (ref 3.87–5.11)
RDW: 14 % (ref 11.5–15.5)
WBC: 6.9 K/uL (ref 4.0–10.5)
nRBC: 0 % (ref 0.0–0.2)

## 2024-06-25 LAB — I-STAT CHEM 8, ED
BUN: 8 mg/dL (ref 6–20)
Calcium, Ion: 1.05 mmol/L — ABNORMAL LOW (ref 1.15–1.40)
Chloride: 107 mmol/L (ref 98–111)
Creatinine, Ser: 0.6 mg/dL (ref 0.44–1.00)
Glucose, Bld: 110 mg/dL — ABNORMAL HIGH (ref 70–99)
HCT: 40 % (ref 36.0–46.0)
Hemoglobin: 13.6 g/dL (ref 12.0–15.0)
Potassium: 3.9 mmol/L (ref 3.5–5.1)
Sodium: 140 mmol/L (ref 135–145)
TCO2: 24 mmol/L (ref 22–32)

## 2024-06-25 LAB — CBG MONITORING, ED: Glucose-Capillary: 112 mg/dL — ABNORMAL HIGH (ref 70–99)

## 2024-06-25 LAB — ETHANOL: Alcohol, Ethyl (B): <15 mg/dL (ref <15)

## 2024-06-25 MED ORDER — GADOBUTROL 1 MMOL/ML IV SOLN
9.0000 mL | Freq: Once | INTRAVENOUS | Status: AC | PRN
Start: 2024-06-25 — End: 2024-06-25
  Administered 2024-06-25: 9 mL via INTRAVENOUS

## 2024-06-25 MED ORDER — KETOROLAC TROMETHAMINE 15 MG/ML IJ SOLN
15.0000 mg | Freq: Once | INTRAMUSCULAR | Status: AC
  Administered 2024-06-25: 15 mg via INTRAVENOUS
  Filled 2024-06-25: qty 1

## 2024-06-25 MED ORDER — PROCHLORPERAZINE EDISYLATE 10 MG/2ML IJ SOLN
10.0000 mg | Freq: Once | INTRAMUSCULAR | Status: AC
  Administered 2024-06-25: 10 mg via INTRAVENOUS
  Filled 2024-06-25: qty 2

## 2024-06-25 MED ORDER — DIPHENHYDRAMINE HCL 50 MG/ML IJ SOLN
12.5000 mg | Freq: Once | INTRAMUSCULAR | Status: AC
  Administered 2024-06-25: 12.5 mg via INTRAVENOUS
  Filled 2024-06-25: qty 1

## 2024-06-25 MED ORDER — IOHEXOL 350 MG/ML SOLN
75.0000 mL | Freq: Once | INTRAVENOUS | Status: AC | PRN
  Administered 2024-06-25: 75 mL via INTRAVENOUS

## 2024-06-25 MED ORDER — SODIUM CHLORIDE 0.9% FLUSH
3.0000 mL | Freq: Once | INTRAVENOUS | Status: AC
  Administered 2024-06-25: 3 mL via INTRAVENOUS

## 2024-06-25 MED ORDER — LACTATED RINGERS IV BOLUS
1000.0000 mL | Freq: Once | INTRAVENOUS | Status: AC
  Administered 2024-06-25: 1000 mL via INTRAVENOUS

## 2024-06-25 NOTE — ED Notes (Signed)
I  used interpreter to triage pt

## 2024-06-25 NOTE — ED Provider Notes (Signed)
 Received patient in turnover from Dr. Francesca.  Please see their note for further details of Hx, PE.  Briefly patient is a 58 y.o. female with a Code Stroke .  Headache and dizziness. Plan for headache cocktail and MRI.  If negative likely home. MRI negative.  D/c home.     Emil Share, DO 06/25/24 2003

## 2024-06-25 NOTE — ED Notes (Signed)
 CCMD called and notified

## 2024-06-25 NOTE — ED Provider Notes (Signed)
 [facility]    CSN: [id number] Arrival date & time: 06/25/24  1121      History   Chief Complaint No chief complaint on file.   HPI Katelyn Fields is a 58 y.o. female with a past medical history of hyperlipidemia, hypertension, and meningioma presents for dizziness.  Interpretation line uses patient speaks Arabic.  patient reports that 4:00 this morning she woke feeling dizzy that she describes as she is spinning and not the room.  It has been persistent and associated with a global headache, nausea, near syncope.  She denies syncope, chest pain, shortness of breath, vomiting, neck pain.  No fevers or URI symptoms.  She also reports about 15 minutes ago she developed left-sided facial numbness and feels weak overall but denies unilateral weakness.  She was recently in the hospital 9/17 for strokelike symptoms.  CT of head showed no evidence of stroke but did show a 9 mm calcified lesion suggestive of meningioma.  CTA of head and neck showed no significant occlusion or stenosis.  Patient was admitted overnight for monitoring.  She did have a MRI of her brain showing the meningioma as well.  She was started on aspirin 81 mg as well as blood pressure medication.  She states she has been taking this as prescribed did not take her aspirin today.  She states either the same symptoms she had last month.  HPI  Past Medical History:  Diagnosis Date   HLD (hyperlipidemia)    Hypertension     Patient Active Problem List   Diagnosis Date Noted   Blurry vision, left eye 05/31/2022   Urinary frequency 05/31/2022   Essential hypertension 11/18/2019   Dyslipidemia 11/18/2019   Healthcare maintenance 11/18/2019    Past Surgical History:  Procedure Laterality Date   ABDOMINAL SURGERY     OVARIAN CYST SURGERY      OB History     Gravida  4   Para      Term      Preterm      AB  4   Living         SAB  4   IAB      Ectopic      Multiple      Live Births                Home Medications    Prior to Admission medications   Medication Sig Start Date End Date Taking? Authorizing Provider  Cholecalciferol  25 MCG (1000 UT) tablet Take 1 tablet (1,000 Units total) by mouth daily. 06/02/22   Delores Suzann HERO, MD  diclofenac  Sodium (VOLTAREN ) 1 % GEL Apply 4 g topically 4 (four) times daily. 07/08/22   Delores Suzann HERO, MD  ibuprofen  (ADVIL ) 600 MG tablet Take 1 tablet (600 mg total) by mouth every 6 (six) hours as needed. 09/11/23   Christopher Savannah, PA-C  Melatonin 3 MG CAPS Take up to 3 capsules nightly Patient not taking: Reported on 05/31/2022 08/04/20   Delores Suzann HERO, MD  Multiple Vitamin (MULTIVITAMIN ADULT PO) Take by mouth. Patient not taking: Reported on 05/31/2022    [provider]  ondansetron  (ZOFRAN -ODT) 8 MG disintegrating tablet Take 1 tablet (8 mg total) by mouth every 8 (eight) hours as needed for nausea or vomiting. 09/11/23   Christopher Savannah, PA-C  promethazine -dextromethorphan (PROMETHAZINE -DM) 6.25-15 MG/5ML syrup Take 5 mLs by mouth 3 (three) times daily as needed for cough. 09/11/23   Christopher Savannah, PA-C  Family History Family History  Problem Relation Age of Onset   Hypertension Mother    Hypertension Father    Hypertension Sister    Asthma Sister    Diabetes Brother     Social History Social History   Tobacco Use   Smoking status: Never   Smokeless tobacco: Never  Vaping Use   Vaping status: Never Used  Substance Use Topics   Alcohol use: No   Drug use: No     Allergies   Patient has no known allergies.   Review of Systems Review of Systems  Neurological:  Positive for dizziness, weakness, numbness and headaches.     Physical Exam Triage Vital Signs ED Triage Vitals [06/25/24 1136]  Encounter Vitals Group     BP (!) 165/85     Girls Systolic BP Percentile      Girls Diastolic BP Percentile      Boys Systolic BP Percentile      Boys Diastolic BP Percentile      Pulse Rate 78     Resp 17      Temp (!) 97.3 F (36.3 C)     Temp Source Oral     SpO2 98 %     Weight      Height      Head Circumference      Peak Flow      Pain Score      Pain Loc      Pain Education      Exclude from Growth Chart    No data found.  Updated Vital Signs BP (!) 165/85   Pulse 78   Temp (!) 97.3 F (36.3 C) (Oral)   Resp 17   SpO2 98%   Visual Acuity Right Eye Distance:   Left Eye Distance:   Bilateral Distance:    Right Eye Near:   Left Eye Near:    Bilateral Near:     Physical Exam Vitals and nursing note reviewed.  Constitutional:      General: She is not in acute distress.    Appearance: Normal appearance. She is not ill-appearing, toxic-appearing or diaphoretic.  HENT:     Head: Normocephalic and atraumatic.  Eyes:     Extraocular Movements: Extraocular movements intact.     Conjunctiva/sclera: Conjunctivae normal.     Pupils: Pupils are equal, round, and reactive to light.  Cardiovascular:     Rate and Rhythm: Normal rate and regular rhythm.     Heart sounds: Normal heart sounds.  Pulmonary:     Effort: Pulmonary effort is normal.     Breath sounds: Normal breath sounds.  Skin:    General: Skin is warm and dry.  Neurological:     General: No focal deficit present.     Mental Status: She is alert and oriented to person, place, and time.     GCS: GCS eye subscore is 4. GCS verbal subscore is 5. GCS motor subscore is 6.     Cranial Nerves: No facial asymmetry.     Motor: No weakness.     Comments: Strength is 5 out of 5 bilateral upper extremities.  She does have decreased sensation to the left cheek.  Patient unable to stand due to her dizziness.  Psychiatric:        Mood and Affect: Mood normal.        Behavior: Behavior normal.      UC Treatments / Results  Labs (all labs ordered are listed, but only  abnormal results are displayed) Labs Reviewed  GLUCOSE, POCT (MANUAL RESULT ENTRY) - Abnormal; Notable for the following components:      Result Value    POC Glucose 104 (*)    All other components within normal limits    EKG   Radiology No results found.  Procedures ED EKG  Date/Time: 06/25/2024 11:51 AM  Performed by: Loreda Myla SAUNDERS, NP Authorized by: Loreda Myla SAUNDERS, NP   Interpretation:    Interpretation: abnormal   Rate:    ECG rate:  89   ECG rate assessment: normal   Rhythm:    Rhythm: sinus rhythm   Ectopy:    Ectopy: none   ST segments:    ST segments:  Normal T waves:    T waves: inverted    (including critical care time)  Medications Ordered in UC Medications - No data to display  Initial Impression / Assessment and Plan / UC Course  I have reviewed the triage vital signs and the nursing notes.  Pertinent labs & imaging results that were available during my care of the patient were reviewed by me and considered in my medical decision making (see chart for details).     I reviewed symptoms and exam with patient.  Discussed limitations and abilities of urgent care.  EKG shows T wave abnormality.  Blood sugar 104.  Given her persistent dizziness with facial numbness and weakness I did advise she go to the emergency room via EMS for further evaluation to rule out any life-threatening causes of her symptoms.  She is in agreement with plan was taken via EMS to the emergency room. Final Clinical Impressions(s) / UC Diagnoses   Final diagnoses:  Dizziness  Left facial numbness  Intractable headache, unspecified chronicity pattern, unspecified headache type  Nonspecific abnormal electrocardiogram (ECG) (EKG)     Discharge Instructions      Patient taken to the ER via EMS     ED Prescriptions   None    PDMP not reviewed this encounter.   Loreda Myla SAUNDERS, NP 06/25/24 1154

## 2024-06-25 NOTE — ED Triage Notes (Signed)
 Pt was ib [facility] from the urgent care with complaints of stroke like symptoms. The patient expressed she drove to work at 8 am and then around 9 she felt some jaw tingling and then around 11 she was feeling dizzy, weakness, and sensory difference in the left side. Patient also endorsed headache. EMS noted left leg drift, left sided sensory differences, and bilateral nystagmus.   EMS VITALS 138/80 98% RA 70 HR CBG 104

## 2024-06-25 NOTE — Code Documentation (Addendum)
 Stroke Response Nurse Documentation Code Documentation  Katelyn Fields is a 58 y.o. female arriving to [facility]  via [facility] on 06/25/2024 with past medical hx of meningioma, HTN HLD. On aspirin 325 mg daily. Code stroke was activated by EMS.   Patient from [facility] where she was LKW at 0900 and now complaining of dizziness and left sided sensory loss .  Stroke team at the bedside on patient arrival. Labs drawn and patient cleared for CT by Dr. Earlean. Patient to CT with team. NIHSS 2, see documentation for details and code stroke times. Patient with left leg weakness and left decreased sensation on exam. The following imaging was completed:  CT Head and CTA. Patient is not a candidate for IV Thrombolytic due to stroke not suspected, TMTT nearing end of window. Patient is not a candidate for IR due to LVO negative.   Care Plan:   No acute treatment/TIA alert: q2h x 12 hours NIHSS & VS, then q4h   Bedside handoff with ED RN Katelyn.    Saifullah Jolley Livengood  Stroke Response RN

## 2024-06-25 NOTE — Discharge Instructions (Addendum)
Patient taken to the ER via EMS

## 2024-06-25 NOTE — Discharge Instructions (Signed)
 Your MRI looks ok.  Please follow up with your neurologist.

## 2024-06-25 NOTE — Consult Note (Addendum)
 NEUROLOGY CONSULT NOTE   Date of service: June 25, 2024 Patient Name: Katelyn Fields MRN:  [id number] DOB:  [birth year] Chief Complaint: code stroke  Requesting Provider: Francesca Elsie CROME, MD  History of Present Illness  Obtained with Arabic interpreter.  Katelyn Fields is a 58 y.o. female with hx of HTN, HLD arabic speaking who presents via EMS as a Code stroke for acute dizziness, left side numbness and weakness. Interpretor was used via IPAD, as soon as patient arrived in the ED. Patient states that LKW 0900 when she suddenly became dizzy. 10 minutes later she felt like she was going to pass out and vomit, than had numbness that spread across her left side of her body, starting in her left jaw and then spreading down to her arm and then leg. Gait was observed while in Ct room and gait was intact but cautious. She does c/o headache.  NIHSS 2.  Code stroke CT head no acute process. CTA head and neck with NO LVO.   She had a similar presentation last month and was admitted to [facility] and stroke TIA workup with diagnosis of TIA vs acephalgic migraine and started on full dose ASA. At that time, she woke up in the middle of the night with left sided numbness that resolved after about 5 hours. She also reports then and now feeling a mild headache and muffled hearing as if she was under water.  LKW: 0900 Modified rankin score: 0-Completely asymptomatic and back to baseline post- stroke IV Thrombolysis:  No low NIHSS  EVT:  No LVO    NIHSS components Score: Comment  1a Level of Conscious 0[]  1[]  2[]  3[]      1b LOC Questions 0[]  1[]  2[]       1c LOC Commands 0[]  1[]  2[]       2 Best Gaze 0[]  1[]  2[]       3 Visual 0[]  1[]  2[]  3[]      4 Facial Palsy 0[]  1[]  2[]  3[]      5a Motor Arm - left 0[]  1[]  2[]  3[]  4[]  UN[]    5b Motor Arm - Right 0[]  1[]  2[]  3[]  4[]  UN[]    6a Motor Leg - Left 0[]  1[x]  2[]  3[]  4[]  UN[]    6b Motor Leg - Right 0[]  1[]  2[]  3[]  4[]  UN[]    7 Limb Ataxia 0[]  1[]  2[]   UN[]      8 Sensory 0[]  1[x]  2[]  UN[]      9 Best Language 0[]  1[]  2[]  3[]      10 Dysarthria 0[]  1[]  2[]  UN[]      11 Extinct. and Inattention 0[]  1[]  2[]       TOTAL: 2      ROS  Comprehensive ROS performed and pertinent positives documented in HPI    Past History   Past Medical History:  Diagnosis Date   HLD (hyperlipidemia)    Hypertension     Past Surgical History:  Procedure Laterality Date   ABDOMINAL SURGERY     OVARIAN CYST SURGERY      Family History: Family History  Problem Relation Age of Onset   Hypertension Mother    Hypertension Father    Hypertension Sister    Asthma Sister    Diabetes Brother     Social History  reports that she has never smoked. She has never used smokeless tobacco. She reports that she does not drink alcohol and does not use drugs.  No Known Allergies  Medications  No current facility-administered medications for this encounter.  Current Outpatient Medications:    Cholecalciferol  25 MCG (1000 UT) tablet, Take 1 tablet (1,000 Units total) by mouth daily., Disp: 90 tablet, Rfl: 3   diclofenac  Sodium (VOLTAREN ) 1 % GEL, Apply 4 g topically 4 (four) times daily., Disp: 350 g, Rfl: 1   ibuprofen  (ADVIL ) 600 MG tablet, Take 1 tablet (600 mg total) by mouth every 6 (six) hours as needed., Disp: 30 tablet, Rfl: 0   Melatonin 3 MG CAPS, Take up to 3 capsules nightly (Patient not taking: Reported on 05/31/2022), Disp: 90 capsule, Rfl: 3   Multiple Vitamin (MULTIVITAMIN ADULT PO), Take by mouth. (Patient not taking: Reported on 05/31/2022), Disp: , Rfl:    ondansetron  (ZOFRAN -ODT) 8 MG disintegrating tablet, Take 1 tablet (8 mg total) by mouth every 8 (eight) hours as needed for nausea or vomiting., Disp: 20 tablet, Rfl: 0   promethazine -dextromethorphan (PROMETHAZINE -DM) 6.25-15 MG/5ML syrup, Take 5 mLs by mouth 3 (three) times daily as needed for cough., Disp: 200 mL, Rfl: 0  Vitals   Vitals:   06/25/24 1200 06/25/24 1300 06/25/24 1320  06/25/24 1353  BP:  (!) 162/76    Pulse:  85  68  Resp:  13  13  Temp:    97.9 F (36.6 C)  TempSrc:    Oral  SpO2:  100%  99%  Weight: 101.8 kg  99.8 kg   Height:   5' (1.524 m)     Body mass index is 42.97 kg/m.   Physical Exam   Constitutional: Appears well-developed and well-nourished.  Psych: Affect appropriate to situation.  Eyes: No scleral injection.  HENT: No OP obstruction.  Head: Normocephalic.  Cardiovascular: Normal rate and regular rhythm.  Respiratory: Effort normal, non-labored breathing.  GI: Soft.  No distension. There is no tenderness.  Skin: WDI.   Neurologic Examination   Mental Status -  Level of arousal and orientation to time, place, and person were intact. Language including expression, naming, repetition, comprehension was assessed and found intact. Attention span and concentration were normal. Recent and remote memory were intact. Fund of Knowledge was assessed and was intact.  Cranial Nerves II - XII - II - Visual field intact OU. III, IV, VI - Extraocular movements intact. Nystagmus on rightward gaze  V - Facial sensation report inconsistent (initially reported feeling L V2 but less than V1 and V3, however later said did not feel it at all) VII - Facial movement intact bilaterally . VIII - Hearing & vestibular intact bilaterally . X - Palate elevates symmetrically . XI - Chin turning & shoulder shrug intact bilaterally . XII - Tongue protrusion intact .  Motor Strength - The patient's strength was normal in bilateral uppers and right lower and pronator drift was absent.  Left lower with mild drift. +Hoover's when assessing L hip flexion that improved with coaching. Bulk was normal and fasciculations were absent .   Motor Tone - Muscle tone was assessed at the neck and appendages and was normal . Sensory - decreased on left Coordination - The patient had normal movements in the hands and feet with no ataxia or dysmetria.  Tremor was  absent. Gait and Station - Normal width base, able to maintain balance while standing with minimal support, short steps without veering toward any direction.  HINTS: - Head impulse negative - Nystagmus - horizontal nystagmus beating to the right when looking to the right - Test of skew negative  Dix-Hallpike: - Reported dizziness sensation when performed to her right. Reported improvement  in dizziness when performed to her left, with observed nystagmus.   Labs/Imaging/Neurodiagnostic studies   CBC:  Recent Labs  Lab 06/29/24 1349  HGB 13.6  HCT 40.0   Basic Metabolic Panel:  Lab Results  Component Value Date   NA 140 29-Jun-2024   K 3.9 06-29-2024   CO2 22 05/31/2022   GLUCOSE 110 (H) 06/29/2024   BUN 8 Jun 29, 2024   CREATININE 0.60 June 29, 2024   CALCIUM  9.3 05/31/2022   GFRNONAA >60 09/03/2021   GFRAA 92 11/18/2019   Lipid Panel:  Lab Results  Component Value Date   LDLCALC 180 (H) 05/31/2022   HgbA1c:  Lab Results  Component Value Date   HGBA1C 5.7 (H) 05/31/2022   Urine Drug Screen: No results found for: LABOPIA, COCAINSCRNUR, LABBENZ, AMPHETMU, THCU, LABBARB  Alcohol Level No results found for: ETH INR No results found for: INR APTT No results found for: APTT AED levels: No results found for: PHENYTOIN, ZONISAMIDE, LAMOTRIGINE, LEVETIRACETA  CT Head without contrast(Personally reviewed): NO acute process   CT angio Head and Neck with contrast(Personally reviewed): No LVO   ASSESSMENT   LOLETA FROMMELT is a 58 y.o. female HTN, HLD arabic speaking who presents via EMS as a Code stroke for acute dizziness, left side numbness and weakness. Interpretor was used via IPAD, as soon as patient arrived in the ED. Patient states that LKW 0900 when she suddenly became dizzy. 10 minutes later she felt like she was going to pass out and vomit, than had numbness that spread across her left side of her body. Gait was observed while in Ct room and gait  was intact but cautious. She does c/o headache.  NIHSS 2.   Presentation of negative HINTS, unilateral nystagmus, positive Dix Hallpike suggestive of peripheral etiology. In addition, reported she was sick about 2 weeks prior to September 2025 presentation of similar symptoms. Additional symptom of muffled hearing and onset after illness consistent with labyrinthitis.  As she also endorses a headache, will recommend MRI brain with/without contrast (as prior MRI at [facility] was without).  RECOMMENDATIONS  Obtain MRI brain w/wo to evaluate for acute process. If positive for acute stroke will continue with full stroke workup.  Treat headache  ______________________________________________________________________    Signed, Karna DELENA Geralds, NP [facility]  NEUROHOSPITALIST ADDENDUM Performed a face to face diagnostic evaluation.   I have reviewed the contents of history and physical exam as documented by PA/ARNP/Resident and agree with above documentation.  I have discussed and formulated the above plan as documented. Edits to the note have been made as needed.  Mickael Mcnutt, MD [facility]

## 2024-06-25 NOTE — ED Provider Notes (Signed)
 [place] EMERGENCY DEPARTMENT AT [facility] Provider Note  CSN: [id number] Arrival date & time: 06/25/24 1232  Chief Complaint(s) Code Stroke  HPI Katelyn Fields is a 58 y.o. female history of, hypertension presenting to the emergency department with dizziness.  Patient reports today this morning developing dizziness, headache on the left, lightheadedness, numbness on the left.  Paramedics felt patient may have some left-sided leg weakness so was concern for possible stroke.  Patient was activated as code stroke.  Patient was seen by neurology on arrival.  Patient reports that she had a somewhat similar episode last month, was admitted at [facility] had an MRI which is negative.  She denies any weakness currently.  She reports that dizziness is improved with sitting still.  No falls or head injury.  No fevers or chills.  No neck stiffness.  No neck or back pain.  No chest pain.  No nausea or vomiting.  Offered Arabic interpreter, patient declined  Past Medical History Past Medical History:  Diagnosis Date   HLD (hyperlipidemia)    Hypertension    Patient Active Problem List   Diagnosis Date Noted   Blurry vision, left eye 05/31/2022   Urinary frequency 05/31/2022   Essential hypertension 11/18/2019   Dyslipidemia 11/18/2019   Healthcare maintenance 11/18/2019   Home Medication(s) Prior to Admission medications   Medication Sig Start Date End Date Taking? Authorizing Provider  Cholecalciferol  25 MCG (1000 UT) tablet Take 1 tablet (1,000 Units total) by mouth daily. 06/02/22   Delores Suzann HERO, MD  diclofenac  Sodium (VOLTAREN ) 1 % GEL Apply 4 g topically 4 (four) times daily. 07/08/22   Delores Suzann HERO, MD  ibuprofen  (ADVIL ) 600 MG tablet Take 1 tablet (600 mg total) by mouth every 6 (six) hours as needed. 09/11/23   Christopher Savannah, PA-C  Melatonin 3 MG CAPS Take up to 3 capsules nightly Patient not taking: Reported on 05/31/2022 08/04/20   Delores Suzann HERO, MD  Multiple Vitamin  (MULTIVITAMIN ADULT PO) Take by mouth. Patient not taking: Reported on 05/31/2022    [provider]  ondansetron  (ZOFRAN -ODT) 8 MG disintegrating tablet Take 1 tablet (8 mg total) by mouth every 8 (eight) hours as needed for nausea or vomiting. 09/11/23   Christopher Savannah, PA-C  promethazine -dextromethorphan (PROMETHAZINE -DM) 6.25-15 MG/5ML syrup Take 5 mLs by mouth 3 (three) times daily as needed for cough. 09/11/23   Christopher Savannah, PA-C                                                                                                                                    Past Surgical History Past Surgical History:  Procedure Laterality Date   ABDOMINAL SURGERY     OVARIAN CYST SURGERY     Family History Family History  Problem Relation Age of Onset   Hypertension Mother    Hypertension Father    Hypertension Sister    Asthma Sister  Diabetes Brother     Social History Social History   Tobacco Use   Smoking status: Never   Smokeless tobacco: Never  Vaping Use   Vaping status: Never Used  Substance Use Topics   Alcohol use: No   Drug use: No   Allergies Patient has no known allergies.  Review of Systems Review of Systems  All other systems reviewed and are negative.   Physical Exam Vital Signs  I have reviewed the triage vital signs BP (!) 162/76   Pulse 68   Temp 97.9 F (36.6 C) (Oral)   Resp 13   Ht 5' (1.524 m)   Wt 99.8 kg   SpO2 99%   BMI 42.97 kg/m  Physical Exam Vitals and nursing note reviewed.  Constitutional:      General: She is not in acute distress.    Appearance: She is well-developed.  HENT:     Head: Normocephalic and atraumatic.     Mouth/Throat:     Mouth: Mucous membranes are moist.  Eyes:     Pupils: Pupils are equal, round, and reactive to light.     Comments: Rightward beating nystagmus on rightward gaze  Cardiovascular:     Rate and Rhythm: Normal rate and regular rhythm.     Heart sounds: No murmur heard. Pulmonary:      Effort: Pulmonary effort is normal. No respiratory distress.     Breath sounds: Normal breath sounds.  Abdominal:     General: Abdomen is flat.     Palpations: Abdomen is soft.     Tenderness: There is no abdominal tenderness.  Musculoskeletal:        General: No tenderness.     Right lower leg: No edema.     Left lower leg: No edema.  Skin:    General: Skin is warm and dry.  Neurological:     General: No focal deficit present.     Mental Status: She is alert. Mental status is at baseline.     Comments: Cranial nerves II through XII intact, strength 5 out of 5 in the bilateral upper and lower extremities, no sensory deficit to light touch, no dysmetria on finger-nose-finger testing, ambulatory with steady gait.   Psychiatric:        Mood and Affect: Mood normal.        Behavior: Behavior normal.     ED Results and Treatments Labs (all labs ordered are listed, but only abnormal results are displayed) Labs Reviewed  I-STAT CHEM 8, ED - Abnormal; Notable for the following components:      Result Value   Glucose, Bld 110 (*)    Calcium , Ion 1.05 (*)    All other components within normal limits  CBG MONITORING, ED - Abnormal; Notable for the following components:   Glucose-Capillary 112 (*)    All other components within normal limits  PROTIME-INR  APTT  CBC  DIFFERENTIAL  COMPREHENSIVE METABOLIC PANEL WITH GFR  ETHANOL  Radiology CT ANGIO HEAD NECK W WO CM (CODE STROKE) Result Date: 06/25/2024 EXAM: CTA HEAD AND NECK WITHOUT AND WITH 06/25/2024 12:53:35 PM TECHNIQUE: CTA of the head and neck was performed without and with the administration of 75 mL of iohexol (OMNIPAQUE) 350 MG/ML injection. Multiplanar 2D and/or 3D reformatted images are provided for review. Automated exposure control, iterative reconstruction, and/or weight based adjustment of the mA/kV  was utilized to reduce the radiation dose to as low as reasonably achievable. Stenosis of the internal carotid arteries measured using NASCET criteria. COMPARISON: None available CLINICAL HISTORY: Acute neuro deficit, stroke suspected. Dizziness, left-sided numbness, nausea, and slight left-sided facial droop. FINDINGS: CTA NECK: AORTIC ARCH AND ARCH VESSELS: No dissection or arterial injury. No significant stenosis of the brachiocephalic or subclavian arteries. CERVICAL CAROTID ARTERIES: No dissection, arterial injury, or hemodynamically significant stenosis by NASCET criteria. CERVICAL VERTEBRAL ARTERIES: No dissection, arterial injury, or significant stenosis. Mildly dominant left vertebral artery. LUNGS AND MEDIASTINUM: Unremarkable. SOFT TISSUES: No acute abnormality. BONES: No acute abnormality. CTA HEAD: ANTERIOR CIRCULATION: The intracranial internal carotid arteries are widely patent. ACAs and MCAs are patent with branch vessel irregularity but no evidence of a proximal branch occlusion or significant A1 or M1 stenosis. No aneurysm. POSTERIOR CIRCULATION: The intracranial vertebral arteries are widely patent to the basilar. Patent AICA and SCA origins are visualized bilaterally. The basilar artery is widely patent. Posterior communicating arteries are diminutive or absent. Both PCAs are patent without evidence of a significant proximal stenosis. No aneurysm. OTHER: No dural venous sinus thrombosis on this non-dedicated study. IMPRESSION: 1. No large vessel occlusion or significant proximal stenosis in the head or neck. 2. These results were communicated to Dr. Sallyann at 1:00 PM on 06/25/2024 by secure text page via the [facility] messaging system. Electronically signed by: Dasie Hamburg MD 06/25/2024 01:07 PM EDT RP Workstation: [id number]   CT HEAD CODE STROKE WO CONTRAST Result Date: 06/25/2024 EXAM: CT HEAD WITHOUT CONTRAST 06/25/2024 12:41:00 PM TECHNIQUE: CT of the head was performed without the  administration of intravenous contrast. Automated exposure control, iterative reconstruction, and/or weight based adjustment of the mA/kV was utilized to reduce the radiation dose to as low as reasonably achievable. COMPARISON: None available. CLINICAL HISTORY: Acute neuro deficit, stroke suspected, left-sided numbness, dizziness. [id number]. FINDINGS: BRAIN AND VENTRICLES: There is no evidence of an acute infarct, intracranial hemorrhage, midline shift, hydrocephalus, or extra-axial fluid collection. Cerebral volume is normal. The ventricles are normal in size. There is a 1 cm partially calcified extra axial mass over the anterior left frontal convexity without evidence of significant mass effect or brain edema. A partially empty sella is noted. No hyperdense vessel. ORBITS: No acute abnormality. SINUSES: Mild mucosal thickening in the left maxillary sinus. Clear mastoid air cells. SOFT TISSUES AND SKULL: No acute soft tissue abnormality. No skull fracture. [place] Stroke Program Early CT Score (ASPECTS) ----- Ganglionic (caudate, IC, lentiform nucleus, insula, M1-M3): 7 Supraganglionic (M4-M6): 3 Total: 10 IMPRESSION: 1. No acute intracranial abnormality. ASPECTS of 10. 2. Suspected 1 cm left frontal meningioma without significant mass effect or edema. 3. These results were communicated to Dr. Sallyann at 12:50 PM on 06/25/2024 by secure text page via the [facility] messaging system. Electronically signed by: Dasie Hamburg MD 06/25/2024 12:56 PM EDT RP Workstation: [id number]    Pertinent labs & imaging results that were available during my care of the patient were reviewed by me and considered in my medical decision making (see MDM for details).  Medications Ordered in ED  Medications  lactated ringers bolus 1,000 mL (has no administration in time range)  sodium chloride flush (NS) 0.9 % injection 3 mL (3 mLs Intravenous Given 06/25/24 1313)  iohexol (OMNIPAQUE) 350 MG/ML injection 75 mL (75 mLs Intravenous Contrast  Given 06/25/24 1253)  ketorolac (TORADOL) 15 MG/ML injection 15 mg (15 mg Intravenous Given 06/25/24 1443)  prochlorperazine (COMPAZINE) injection 10 mg (10 mg Intravenous Given 06/25/24 1444)  diphenhydrAMINE (BENADRYL) injection 12.5 mg (12.5 mg Intravenous Given 06/25/24 1445)                                                                                                                                     Procedures Procedures  (including critical care time)  Medical Decision Making / ED Course   MDM:  58 year old presenting to the emergency department with headache, dizziness.  Patient overall well-appearing, neurologic exam is nonfocal.  Differential includes migraine, stroke, intracranial process, tumor, BPPV.  Had similar episode 1 month ago with normal MRI.  Seen by neurology as a code stroke.  TNK/EVT considered however symptoms felt very unlikely to be due to stroke.  Neurology recommends obtaining MRI with and without contrast.  Has been ordered.  Will also give migraine cocktail.  Signed out to oncoming provider Dr Emil pending MRI and re-assessment      Additional history obtained: -External records from outside source obtained and reviewed including: Chart review including previous notes, labs, imaging, consultation notes including prior notes    Lab Tests: -I ordered, reviewed, and interpreted labs.   The pertinent results include:   Labs Reviewed  I-STAT CHEM 8, ED - Abnormal; Notable for the following components:      Result Value   Glucose, Bld 110 (*)    Calcium , Ion 1.05 (*)    All other components within normal limits  CBG MONITORING, ED - Abnormal; Notable for the following components:   Glucose-Capillary 112 (*)    All other components within normal limits  PROTIME-INR  APTT  CBC  DIFFERENTIAL  COMPREHENSIVE METABOLIC PANEL WITH GFR  ETHANOL    Notable for mild hyperglycemia    Imaging Studies ordered: I ordered imaging studies including CT  head  On my interpretation imaging demonstrates no acute process I independently visualized and interpreted imaging. I agree with the radiologist interpretation   Medicines ordered and prescription drug management: Meds ordered this encounter  Medications   sodium chloride flush (NS) 0.9 % injection 3 mL   iohexol (OMNIPAQUE) 350 MG/ML injection 75 mL   ketorolac (TORADOL) 15 MG/ML injection 15 mg   prochlorperazine (COMPAZINE) injection 10 mg   lactated ringers bolus 1,000 mL   diphenhydrAMINE (BENADRYL) injection 12.5 mg    -I have reviewed the patients home medicines and have made adjustments as needed   Consultations Obtained: I requested consultation with the neurologist,  and discussed lab and imaging findings as well as pertinent plan - they recommend:  MRI   Cardiac Monitoring: The patient was maintained on a cardiac monitor.  I personally viewed and interpreted the cardiac monitored which showed an underlying rhythm of: NSR  Social Determinants of Health:  Diagnosis or treatment significantly limited by social determinants of health: obesity   Reevaluation: After the interventions noted above, I reevaluated the patient and found that their symptoms have improved  Co morbidities that complicate the patient evaluation  Past Medical History:  Diagnosis Date   HLD (hyperlipidemia)    Hypertension       Dispostion: Disposition decision including need for hospitalization was considered, and patient disposition pending at time of sign out.    Final Clinical Impression(s) / ED Diagnoses Final diagnoses:  Dizziness     This chart was dictated using voice recognition software.  Despite best efforts to proofread,  errors can occur which can change the documentation meaning.    Francesca Elsie CROME, MD 06/25/24 [phone]

## 2024-06-25 NOTE — ED Notes (Signed)
 EMS called fo emergency transport

## 2024-06-25 NOTE — ED Triage Notes (Addendum)
 Pt c/o dizziness upon waking at 0400 today and states it's getting worse. Pt c/o left sided numbness started 15 mins ago and nausea. Pt LKW 2200 yesterday.  Pt has slight left sided facial droop

## 2024-06-25 NOTE — ED Notes (Signed)
 Notified Myla, NP of pt's NIH and triage right away and she is at chair side.
